# Patient Record
Sex: Female | Born: 1985 | Race: Black or African American | Hispanic: No | Marital: Married | Smoking: Never smoker
Health system: Southern US, Community
[De-identification: ages and names within clinical notes are randomized; demographics above are authoritative.]

## PROBLEM LIST (undated history)

## (undated) DIAGNOSIS — Z789 Other specified health status: Secondary | ICD-10-CM

## (undated) HISTORY — PX: NO PAST SURGERIES: SHX2092

---

## 2014-11-19 NOTE — L&D Delivery Note (Cosign Needed)
Delivery Note Pt pushed effectively with some bradycardia in the 90-100 range.  Upon crowning, after a push, the heart rate went down to the 30's and stayed there.  After consent was obtained from the pt and Dr. Macon Large notified, a Kiwi vacuum was placed on the fetal vertex, which was crowning the size of a kiwi fruit. After one push, the head delivered and the vacuum was d/c'd.  The shoulders were a little tight,  so the posterior (left) axilla was grasped with my index finger, and the baby was rotated clockwise into the oblique diameter.  At this point, the (now) anterior shoulder was released, and the baby delivered.  At no time was any traction placed on the baby's head. At 10:30 PM a viable female was delivered via Vaginal, Vacuum (Extractor) (Presentation: Left Occiput Anterior).  APGAR: 7, 9; weight 8 lb 12.9 oz (3995 g).  THe baby was apneic and floppy, so the cord was milked and clamped and cut, and the baby taken to the warmer, where she perked up quickly with stimulation. Placenta status: Intact, Spontaneous.  Cord: 3 vessels with the following complications: .  Cord pH: 7.12  Anesthesia: Epidural  Episiotomy: None Lacerations: 2nd degree;Perineal Suture Repair: 2.0 chromic Est. Blood Loss (mL):  250 Mom to postpartum.  Baby to Couplet care / Skin to Skin.  CRESENZO-DISHMAN,FRANCES 08/18/2015, 11:06 PM

## 2015-03-14 ENCOUNTER — Other Ambulatory Visit (HOSPITAL_COMMUNITY): Payer: Self-pay | Admitting: Urology

## 2015-03-14 DIAGNOSIS — Z0489 Encounter for examination and observation for other specified reasons: Secondary | ICD-10-CM

## 2015-03-14 DIAGNOSIS — IMO0002 Reserved for concepts with insufficient information to code with codable children: Secondary | ICD-10-CM

## 2015-03-14 LAB — OB RESULTS CONSOLE RPR: RPR: NONREACTIVE

## 2015-03-14 LAB — OB RESULTS CONSOLE HIV ANTIBODY (ROUTINE TESTING): HIV: NONREACTIVE

## 2015-03-14 LAB — SICKLE CELL SCREEN: SICKLE CELL SCREEN: NORMAL

## 2015-03-14 LAB — OB RESULTS CONSOLE RUBELLA ANTIBODY, IGM: Rubella: IMMUNE

## 2015-03-14 LAB — OB RESULTS CONSOLE VARICELLA ZOSTER ANTIBODY, IGG: VARICELLA IGG: IMMUNE

## 2015-03-14 LAB — CYSTIC FIBROSIS DIAGNOSTIC STUDY: Interpretation-CFDNA:: NEGATIVE

## 2015-03-14 LAB — OB RESULTS CONSOLE GC/CHLAMYDIA
CHLAMYDIA, DNA PROBE: NEGATIVE
GC PROBE AMP, GENITAL: NEGATIVE

## 2015-03-14 LAB — OB RESULTS CONSOLE HEPATITIS B SURFACE ANTIGEN: Hepatitis B Surface Ag: NEGATIVE

## 2015-04-04 ENCOUNTER — Ambulatory Visit (HOSPITAL_COMMUNITY)
Admission: RE | Admit: 2015-04-04 | Discharge: 2015-04-04 | Disposition: A | Discharge status: Home / Self Care | Payer: 59 | Source: Ambulatory Visit | Attending: Urology | Admitting: Urology

## 2015-04-04 ENCOUNTER — Encounter (HOSPITAL_COMMUNITY): Payer: Self-pay

## 2015-04-04 DIAGNOSIS — IMO0002 Reserved for concepts with insufficient information to code with codable children: Secondary | ICD-10-CM

## 2015-04-04 DIAGNOSIS — Z36 Encounter for antenatal screening of mother: Secondary | ICD-10-CM | POA: Insufficient documentation

## 2015-04-04 DIAGNOSIS — Z0489 Encounter for examination and observation for other specified reasons: Secondary | ICD-10-CM

## 2015-04-04 IMAGING — US US OB COMP +14 WK
1 series · 12 of 28 positions shown · non-contrast
Comparison: none

[Series 1: us ob +14 all · 76 acquisitions, 12 frames shown]
[im 3/76]
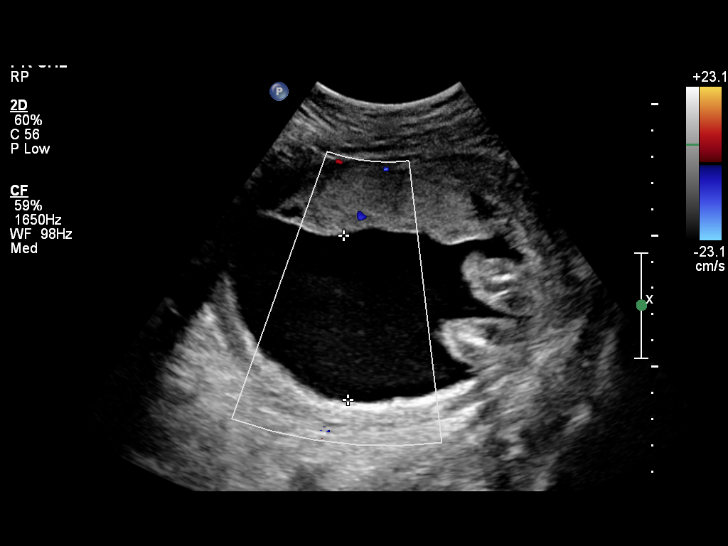
[im 9/76]
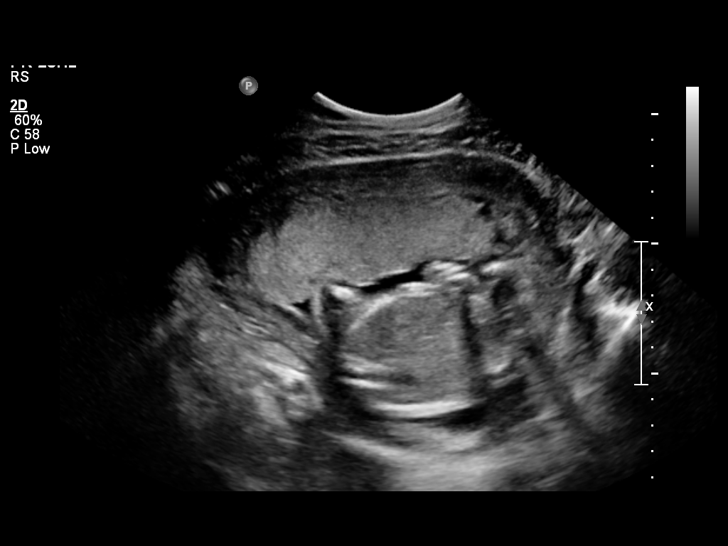
[im 14/76]
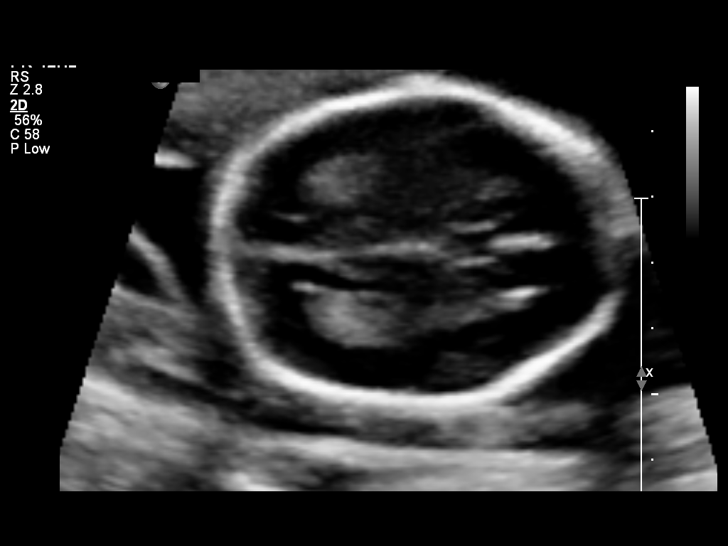
[im 23/76]
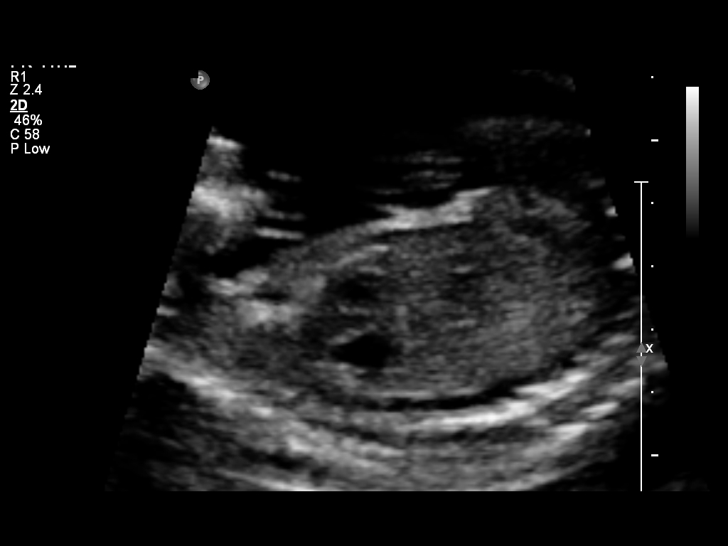
[im 28/76]
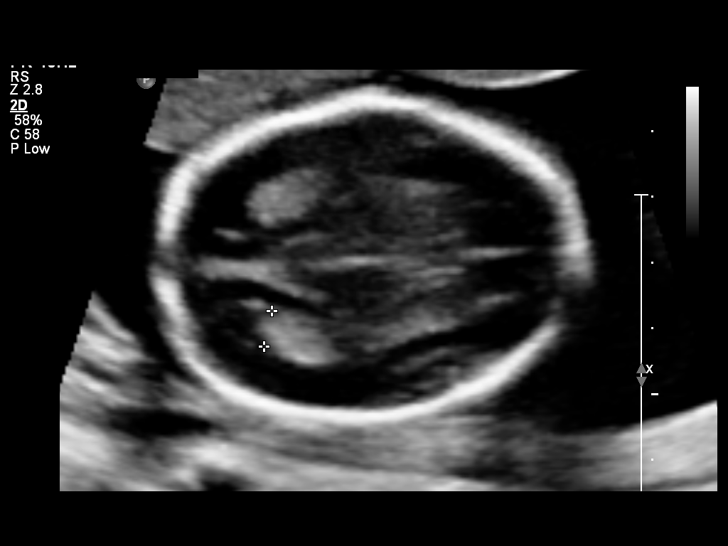
[im 34/76]
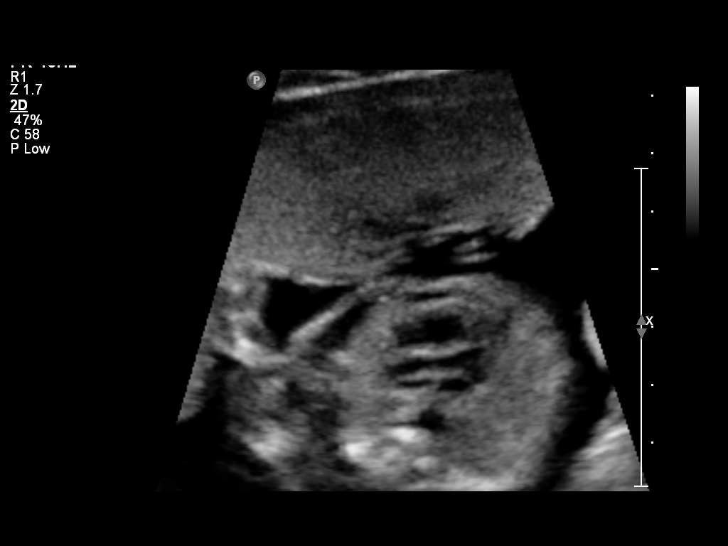
[im 42/76]
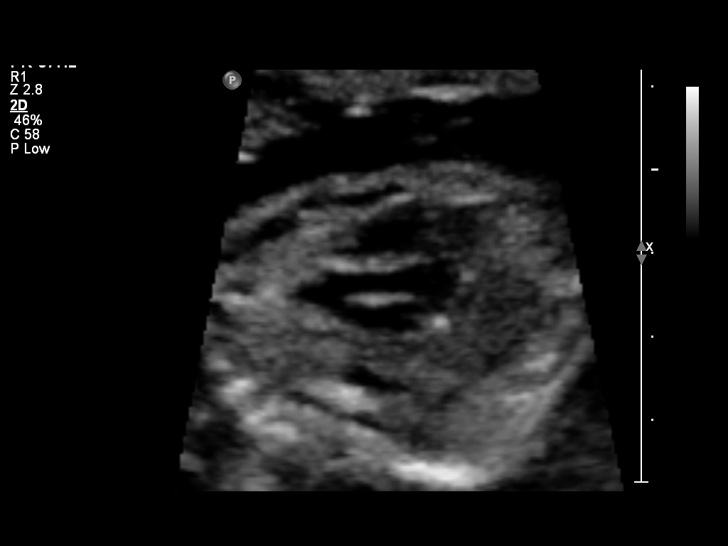
[im 48/76]
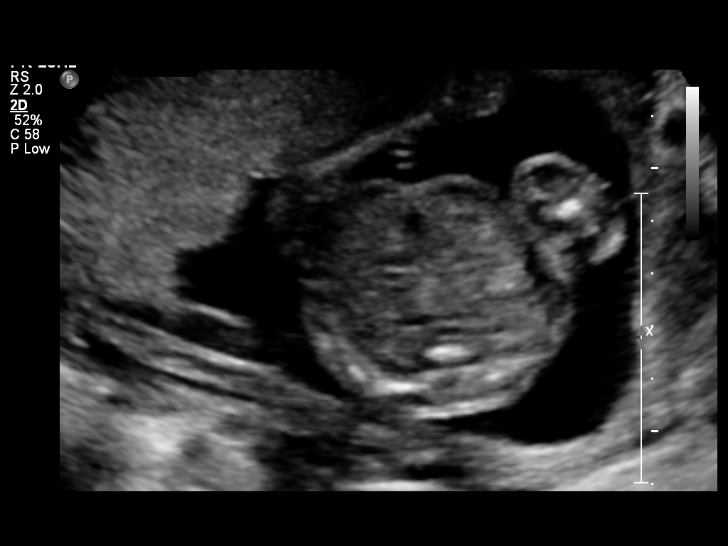
[im 53/76]
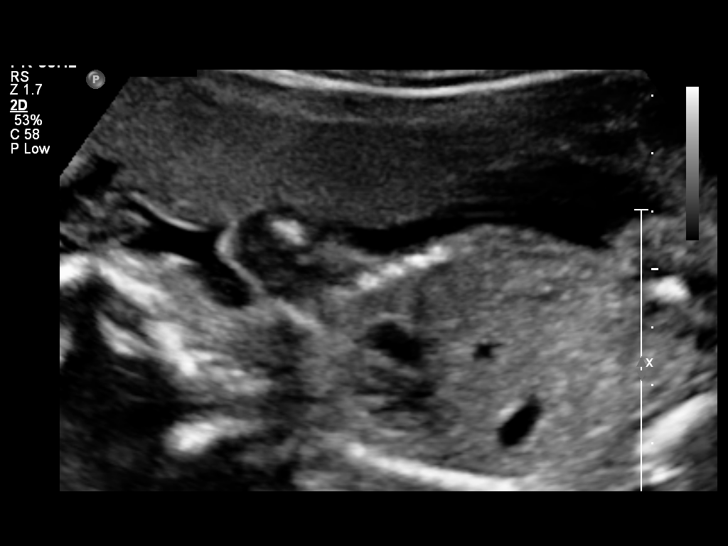
[im 62/76]
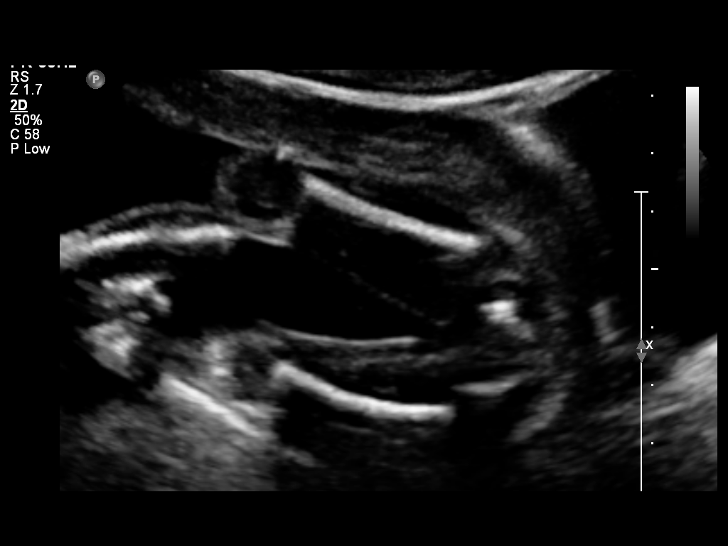
[im 67/76]
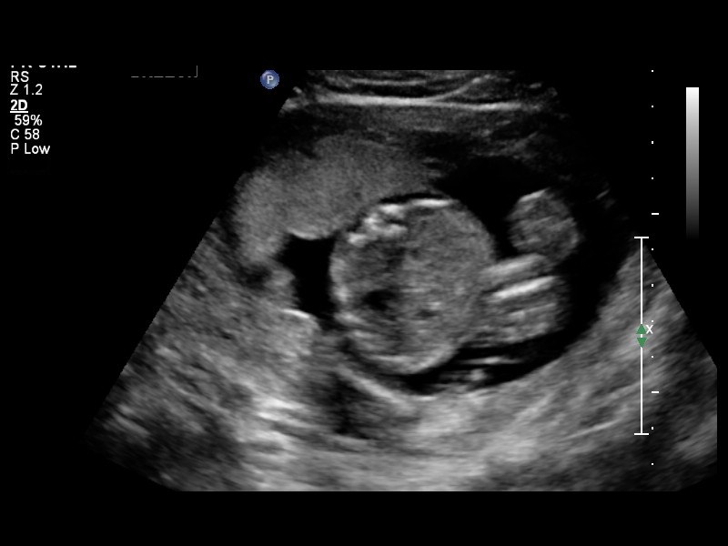
[im 73/76]
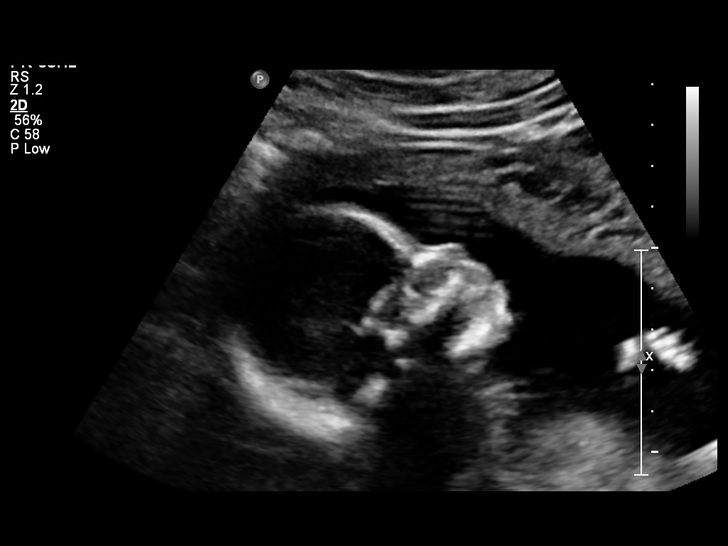

[12 of 28 positions shown; findings below may reference images not displayed]

OBSTETRICS REPORT
(Signed Final [DATE] [DATE])

Service(s) Provided

US OB COMP + 14 WK                                    76805.1
Indications

20 weeks gestation of pregnancy
Basic anatomic survey                                 [id number]
Fetal Evaluation

Num Of Fetuses:    1
Fetal Heart Rate:  152                          bpm
Cardiac Activity:  Observed
Presentation:      Breech
Placenta:          Anterior, above cervical os
P. Cord            Visualized, central
Insertion:

Amniotic Fluid
AFI FV:      Subjectively within normal limits
Larg Pckt:    6.23  cm
Biometry

BPD:     48.1  mm     G. Age:  20w 4d                CI:        70.79   70 - 86
FL/HC:      17.6   15.9 -
20.3
HC:     182.2  mm     G. Age:  20w 4d       37  %    HC/AC:      1.12   1.06 -
1.25
AC:     162.6  mm     G. Age:  21w 3d       64  %    FL/BPD:
FL:      32.1  mm     G. Age:  20w 0d       20  %    FL/AC:      19.7   20 - 24
HUM:     30.9  mm     G. Age:  20w 2d       36  %
CER:     20.6  mm     G. Age:  19w 4d       25  %

Est. FW:     372  gm    0 lb 13 oz      46  %
Gestational Age

LMP:           20w 5d        Date:  [DATE]                 EDD:   [DATE]
U/S Today:     20w 4d                                        EDD:   [DATE]
Best:          20w 5d     Det. By:  LMP  ([DATE])          EDD:   [DATE]
Anatomy

Cranium:          Appears normal         Aortic Arch:      Appears normal
Fetal Cavum:      Appears normal         Ductal Arch:      Appears normal
Ventricles:       Appears normal         Diaphragm:        Appears normal
Choroid Plexus:   Appears normal         Stomach:          Appears normal, left
sided
Cerebellum:       Appears normal         Abdomen:          Appears normal
Posterior Fossa:  Appears normal         Abdominal Wall:   Appears nml (cord
insert, abd wall)
Nuchal Fold:      Not applicable (>20    Cord Vessels:     Appears normal (3
wks GA)                                  vessel cord)
Face:             Appears normal         Kidneys:          Appear normal
(orbits and profile)
Lips:             Appears normal         Bladder:          Appears normal
Heart:            Appears normal         Spine:            Appears normal
(4CH, axis, and
situs)
RVOT:             Appears normal         Lower             Appears normal
Extremities:
LVOT:             Appears normal         Upper             Appears normal
Extremities:

Other:  Fetus appears to be a female. Heels and 5th digit visualized. Nasal
bone visualized.
Targeted Anatomy

Fetal Central Nervous System
Cisterna Magna:
Cervix Uterus Adnexa

Cervical Length:    4.29     cm

Cervix:       Normal appearance by transabdominal scan.
Uterus:       No abnormality visualized.
Cul De Sac:   No free fluid seen.
Left Ovary:    Within normal limits.
Right Ovary:   Within normal limits.

Adnexa:     No abnormality visualized.
Impression

Single IUP at 20w 5d
Normal fetal anatomic survey
No markers associated with aneuploidy noted
Anterior placenta without previa
Normal amniotic fluid volume
Recommendations

Follow-up ultrasounds as clinically indicated.

questions or concerns.

## 2015-07-21 LAB — OB RESULTS CONSOLE GBS: GBS: POSITIVE

## 2015-08-17 ENCOUNTER — Other Ambulatory Visit (HOSPITAL_COMMUNITY): Payer: Self-pay | Admitting: Nurse Practitioner

## 2015-08-17 DIAGNOSIS — Z3A40 40 weeks gestation of pregnancy: Secondary | ICD-10-CM

## 2015-08-17 DIAGNOSIS — O480 Post-term pregnancy: Secondary | ICD-10-CM

## 2015-08-18 ENCOUNTER — Encounter (HOSPITAL_COMMUNITY): Payer: Self-pay | Admitting: *Deleted

## 2015-08-18 ENCOUNTER — Inpatient Hospital Stay (HOSPITAL_COMMUNITY)
Admission: AD | Admit: 2015-08-18 | Discharge: 2015-08-18 | Disposition: A | Discharge status: Home / Self Care | Payer: 59 | Source: Ambulatory Visit | Attending: Obstetrics and Gynecology | Admitting: Obstetrics and Gynecology

## 2015-08-18 ENCOUNTER — Inpatient Hospital Stay (HOSPITAL_COMMUNITY)
Admission: AD | Admit: 2015-08-18 | Discharge: 2015-08-20 | DRG: 775 | Disposition: A | Discharge status: Home / Self Care | Payer: 59 | Source: Ambulatory Visit | Attending: Obstetrics & Gynecology | Admitting: Obstetrics & Gynecology

## 2015-08-18 ENCOUNTER — Inpatient Hospital Stay (HOSPITAL_COMMUNITY): Payer: 59 | Admitting: Anesthesiology

## 2015-08-18 DIAGNOSIS — Z3A40 40 weeks gestation of pregnancy: Secondary | ICD-10-CM

## 2015-08-18 DIAGNOSIS — IMO0001 Reserved for inherently not codable concepts without codable children: Secondary | ICD-10-CM

## 2015-08-18 DIAGNOSIS — O480 Post-term pregnancy: Principal | ICD-10-CM | POA: Diagnosis present

## 2015-08-18 HISTORY — DX: Other specified health status: Z78.9

## 2015-08-18 LAB — CBC
HCT: 36.1 % (ref 36.0–46.0)
Hemoglobin: 12.3 g/dL (ref 12.0–15.0)
MCH: 31.1 pg (ref 26.0–34.0)
MCHC: 34.1 g/dL (ref 30.0–36.0)
MCV: 91.2 fL (ref 78.0–100.0)
PLATELETS: 171 10*3/uL (ref 150–400)
RBC: 3.96 MIL/uL (ref 3.87–5.11)
RDW: 13.7 % (ref 11.5–15.5)
WBC: 11.5 10*3/uL — AB (ref 4.0–10.5)

## 2015-08-18 LAB — TYPE AND SCREEN
ABO/RH(D): O POS
ANTIBODY SCREEN: NEGATIVE

## 2015-08-18 LAB — ABO/RH: ABO/RH(D): O POS

## 2015-08-18 MED ORDER — FENTANYL CITRATE (PF) 100 MCG/2ML IJ SOLN
50.0000 ug | INTRAMUSCULAR | Status: DC | PRN
  Administered 2015-08-18 (×2): 100 ug via INTRAVENOUS
  Filled 2015-08-18 (×2): qty 2

## 2015-08-18 MED ORDER — LACTATED RINGERS IV SOLN
INTRAVENOUS | Status: DC
  Administered 2015-08-18 (×3): via INTRAVENOUS

## 2015-08-18 MED ORDER — PHENYLEPHRINE 40 MCG/ML (10ML) SYRINGE FOR IV PUSH (FOR BLOOD PRESSURE SUPPORT)
80.0000 ug | PREFILLED_SYRINGE | INTRAVENOUS | Status: DC | PRN
  Filled 2015-08-18: qty 20

## 2015-08-18 MED ORDER — LACTATED RINGERS IV SOLN
500.0000 mL | INTRAVENOUS | Status: DC | PRN
  Administered 2015-08-18: 500 mL via INTRAVENOUS

## 2015-08-18 MED ORDER — FENTANYL 2.5 MCG/ML BUPIVACAINE 1/10 % EPIDURAL INFUSION (WH - ANES)
14.0000 mL/h | INTRAMUSCULAR | Status: DC | PRN
  Administered 2015-08-18: 14 mL/h via EPIDURAL
  Administered 2015-08-18: 12 mL/h via EPIDURAL
  Filled 2015-08-18 (×2): qty 125

## 2015-08-18 MED ORDER — OXYTOCIN 40 UNITS IN LACTATED RINGERS INFUSION - SIMPLE MED
1.0000 m[IU]/min | INTRAVENOUS | Status: DC
  Administered 2015-08-18: 2 m[IU]/min via INTRAVENOUS

## 2015-08-18 MED ORDER — ACETAMINOPHEN 325 MG PO TABS: 650.0000 mg | ORAL_TABLET | ORAL | Status: DC | PRN

## 2015-08-18 MED ORDER — PENICILLIN G POTASSIUM 5000000 UNITS IJ SOLR
2.5000 10*6.[IU] | INTRAVENOUS | Status: DC
  Administered 2015-08-18 (×2): 2.5 10*6.[IU] via INTRAVENOUS
  Filled 2015-08-18 (×7): qty 2.5

## 2015-08-18 MED ORDER — BUPIVACAINE HCL (PF) 0.25 % IJ SOLN
INTRAMUSCULAR | Status: DC | PRN
  Administered 2015-08-18 (×2): 4 mL via EPIDURAL

## 2015-08-18 MED ORDER — OXYTOCIN BOLUS FROM INFUSION: 500.0000 mL | INTRAVENOUS | Status: DC

## 2015-08-18 MED ORDER — LIDOCAINE HCL (PF) 1 % IJ SOLN
30.0000 mL | INTRAMUSCULAR | Status: DC | PRN
Start: 2015-08-18 — End: 2015-08-19
  Filled 2015-08-18: qty 30

## 2015-08-18 MED ORDER — OXYCODONE-ACETAMINOPHEN 5-325 MG PO TABS: 2.0000 | ORAL_TABLET | ORAL | Status: DC | PRN

## 2015-08-18 MED ORDER — ONDANSETRON HCL 4 MG/2ML IJ SOLN: 4.0000 mg | Freq: Four times a day (QID) | INTRAMUSCULAR | Status: DC | PRN

## 2015-08-18 MED ORDER — CITRIC ACID-SODIUM CITRATE 334-500 MG/5ML PO SOLN: 30.0000 mL | ORAL | Status: DC | PRN

## 2015-08-18 MED ORDER — OXYTOCIN 40 UNITS IN LACTATED RINGERS INFUSION - SIMPLE MED
62.5000 mL/h | INTRAVENOUS | Status: DC
  Administered 2015-08-18: 62.5 mL/h via INTRAVENOUS
  Filled 2015-08-18: qty 1000

## 2015-08-18 MED ORDER — OXYCODONE-ACETAMINOPHEN 5-325 MG PO TABS: 1.0000 | ORAL_TABLET | ORAL | Status: DC | PRN

## 2015-08-18 MED ORDER — LIDOCAINE-EPINEPHRINE (PF) 2 %-1:200000 IJ SOLN
INTRAMUSCULAR | Status: DC | PRN
  Administered 2015-08-18: 4 mL

## 2015-08-18 MED ORDER — TERBUTALINE SULFATE 1 MG/ML IJ SOLN: 0.2500 mg | Freq: Once | INTRAMUSCULAR | Status: DC | PRN

## 2015-08-18 MED ORDER — DIPHENHYDRAMINE HCL 50 MG/ML IJ SOLN: 12.5000 mg | INTRAMUSCULAR | Status: DC | PRN

## 2015-08-18 MED ORDER — PENICILLIN G POTASSIUM 5000000 UNITS IJ SOLR
5.0000 10*6.[IU] | Freq: Once | INTRAVENOUS | Status: AC
  Administered 2015-08-18: 5 10*6.[IU] via INTRAVENOUS
  Filled 2015-08-18: qty 5

## 2015-08-18 MED ORDER — FLEET ENEMA 7-19 GM/118ML RE ENEM: 1.0000 | ENEMA | RECTAL | Status: DC | PRN

## 2015-08-18 MED ORDER — EPHEDRINE 5 MG/ML INJ: 10.0000 mg | INTRAVENOUS | Status: DC | PRN

## 2015-08-18 NOTE — Anesthesia Preprocedure Evaluation (Signed)
Anesthesia Evaluation  Patient identified by MRN, date of birth, ID band Patient awake    Reviewed: Allergy & Precautions, Patient's Chart, lab work & pertinent test results  History of Anesthesia Complications Negative for: history of anesthetic complications  Airway Mallampati: III  TM Distance: >3 FB Neck ROM: Full    Dental  (+) Teeth Intact   Pulmonary neg pulmonary ROS,  breath sounds clear to auscultation        Cardiovascular negative cardio ROS  Rhythm:Regular     Neuro/Psych negative neurological ROS  negative psych ROS   GI/Hepatic negative GI ROS, Neg liver ROS,   Endo/Other  negative endocrine ROS  Renal/GU negative Renal ROS     Musculoskeletal   Abdominal   Peds  Hematology negative hematology ROS (+)   Anesthesia Other Findings   Reproductive/Obstetrics (+) Pregnancy                             Anesthesia Physical Anesthesia Plan  ASA: II  Anesthesia Plan: Epidural   Post-op Pain Management:    Induction:   Airway Management Planned:   Additional Equipment:   Intra-op Plan:   Post-operative Plan:   Informed Consent: I have reviewed the patients History and Physical, chart, labs and discussed the procedure including the risks, benefits and alternatives for the proposed anesthesia with the patient or authorized representative who has indicated his/her understanding and acceptance.   Dental advisory given  Plan Discussed with: Anesthesiologist  Anesthesia Plan Comments:         Anesthesia Quick Evaluation  

## 2015-08-18 NOTE — Progress Notes (Signed)
   Alicia Avila is a 29 y.o. G1P0 at [redacted]w[redacted]d  admitted for active labor  Subjective: Comfortable; no pressure  Objective: Filed Vitals:   08/18/15 1900 08/18/15 1930 08/18/15 2000 08/18/15 2030  BP: 134/80 131/76 128/77 115/69  Pulse: 93 101 98 98  Temp:  99.2 F (37.3 C)    TempSrc:  Oral    Resp: 18 18    Height:      Weight:      SpO2:          FHT:  FHR: 125 bpm, variability: moderate,  accelerations:  Present,  decelerations:  Absent UC:   irregular, every 1.5-4 minutes SVE:   Dilation: Lip/rim Effacement (%): 90 Station: +1 Exam by:: Cammy Copa, RN Pitocin @ 6 mu/min  Labs: Lab Results  Component Value Date   WBC 11.5* 08/18/2015   HGB 12.3 08/18/2015   HCT 36.1 08/18/2015   MCV 91.2 08/18/2015   PLT 171 08/18/2015    Assessment / Plan: Spontaneous labor, progressing normally  Labor: Progressing normally Fetal Wellbeing:  Category I Pain Control:  Epidural Anticipated MOD:  NSVD  CRESENZO-DISHMAN,FRANCES 08/18/2015, 9:35 PM

## 2015-08-18 NOTE — Anesthesia Procedure Notes (Signed)
Epidural Patient location during procedure: OB  Staffing Anesthesiologist: MOSER, CHRISTOPHER Performed by: anesthesiologist   Preanesthetic Checklist Completed: patient identified, surgical consent, pre-op evaluation, timeout performed, IV checked, risks and benefits discussed and monitors and equipment checked  Epidural Patient position: sitting Prep: DuraPrep Patient monitoring: heart rate, cardiac monitor, continuous pulse ox and blood pressure Approach: midline Location: L3-L4 Injection technique: LOR saline  Needle:  Needle type: Tuohy  Needle gauge: 17 G Needle length: 9 cm Needle insertion depth: 7 cm Catheter type: closed end flexible Catheter size: 19 Gauge Catheter at skin depth: 14 cm Test dose: negative and 2% lidocaine with Epi 1:200 K  Assessment Events: blood not aspirated, injection not painful, no injection resistance, negative IV test and no paresthesia  Additional Notes Reason for block:procedure for pain   

## 2015-08-18 NOTE — Discharge Instructions (Signed)
Third Trimester of Pregnancy °The third trimester is from week 29 through week 42, months 7 through 9. The third trimester is a time when the fetus is growing rapidly. At the end of the ninth month, the fetus is about 20 inches in length and weighs 6-10 pounds.  °BODY CHANGES °Your body goes through many changes during pregnancy. The changes vary from woman to woman.  °· Your weight will continue to increase. You can expect to gain 25-35 pounds (11-16 kg) by the end of the pregnancy. °· You may begin to get stretch marks on your hips, abdomen, and breasts. °· You may urinate more often because the fetus is moving lower into your pelvis and pressing on your bladder. °· You may develop or continue to have heartburn as a result of your pregnancy. °· You may develop constipation because certain hormones are causing the muscles that push waste through your intestines to slow down. °· You may develop hemorrhoids or swollen, bulging veins (varicose veins). °· You may have pelvic pain because of the weight gain and pregnancy hormones relaxing your joints between the bones in your pelvis. Backaches may result from overexertion of the muscles supporting your posture. °· You may have changes in your hair. These can include thickening of your hair, rapid growth, and changes in texture. Some women also have hair loss during or after pregnancy, or hair that feels dry or thin. Your hair will most likely return to normal after your baby is born. °· Your breasts will continue to grow and be tender. A yellow discharge may leak from your breasts called colostrum. °· Your belly button may stick out. °· You may feel short of breath because of your expanding uterus. °· You may notice the fetus "dropping," or moving lower in your abdomen. °· You may have a bloody mucus discharge. This usually occurs a few days to a week before labor begins. °· Your cervix becomes thin and soft (effaced) near your due date. °WHAT TO EXPECT AT YOUR PRENATAL  EXAMS  °You will have prenatal exams every 2 weeks until week 36. Then, you will have weekly prenatal exams. During a routine prenatal visit: °· You will be weighed to make sure you and the fetus are growing normally. °· Your blood pressure is taken. °· Your abdomen will be measured to track your baby's growth. °· The fetal heartbeat will be listened to. °· Any test results from the previous visit will be discussed. °· You may have a cervical check near your due date to see if you have effaced. °At around 36 weeks, your caregiver will check your cervix. At the same time, your caregiver will also perform a test on the secretions of the vaginal tissue. This test is to determine if a type of bacteria, Group B streptococcus, is present. Your caregiver will explain this further. °Your caregiver may ask you: °· What your birth plan is. °· How you are feeling. °· If you are feeling the baby move. °· If you have had any abnormal symptoms, such as leaking fluid, bleeding, severe headaches, or abdominal cramping. °· If you have any questions. °Other tests or screenings that may be performed during your third trimester include: °· Blood tests that check for low iron levels (anemia). °· Fetal testing to check the health, activity level, and growth of the fetus. Testing is done if you have certain medical conditions or if there are problems during the pregnancy. °FALSE LABOR °You may feel small, irregular contractions that   eventually go away. These are called Braxton Hicks contractions, or false labor. Contractions may last for hours, days, or even weeks before true labor sets in. If contractions come at regular intervals, intensify, or become painful, it is best to be seen by your caregiver.  °SIGNS OF LABOR  °· Menstrual-like cramps. °· Contractions that are 5 minutes apart or less. °· Contractions that start on the top of the uterus and spread down to the lower abdomen and back. °· A sense of increased pelvic pressure or back  pain. °· A watery or bloody mucus discharge that comes from the vagina. °If you have any of these signs before the 37th week of pregnancy, call your caregiver right away. You need to go to the hospital to get checked immediately. °HOME CARE INSTRUCTIONS  °· Avoid all smoking, herbs, alcohol, and unprescribed drugs. These chemicals affect the formation and growth of the baby. °· Follow your caregiver's instructions regarding medicine use. There are medicines that are either safe or unsafe to take during pregnancy. °· Exercise only as directed by your caregiver. Experiencing uterine cramps is a good sign to stop exercising. °· Continue to eat regular, healthy meals. °· Wear a good support bra for breast tenderness. °· Do not use hot tubs, steam rooms, or saunas. °· Wear your seat belt at all times when driving. °· Avoid raw meat, uncooked cheese, cat litter boxes, and soil used by cats. These carry germs that can cause birth defects in the baby. °· Take your prenatal vitamins. °· Try taking a stool softener (if your caregiver approves) if you develop constipation. Eat more high-fiber foods, such as fresh vegetables or fruit and whole grains. Drink plenty of fluids to keep your urine clear or pale yellow. °· Take warm sitz baths to soothe any pain or discomfort caused by hemorrhoids. Use hemorrhoid cream if your caregiver approves. °· If you develop varicose veins, wear support hose. Elevate your feet for 15 minutes, 3-4 times a day. Limit salt in your diet. °· Avoid heavy lifting, wear low heal shoes, and practice good posture. °· Rest a lot with your legs elevated if you have leg cramps or low back pain. °· Visit your dentist if you have not gone during your pregnancy. Use a soft toothbrush to brush your teeth and be gentle when you floss. °· A sexual relationship may be continued unless your caregiver directs you otherwise. °· Do not travel far distances unless it is absolutely necessary and only with the approval  of your caregiver. °· Take prenatal classes to understand, practice, and ask questions about the labor and delivery. °· Make a trial run to the hospital. °· Pack your hospital bag. °· Prepare the baby's nursery. °· Continue to go to all your prenatal visits as directed by your caregiver. °SEEK MEDICAL CARE IF: °· You are unsure if you are in labor or if your water has broken. °· You have dizziness. °· You have mild pelvic cramps, pelvic pressure, or nagging pain in your abdominal area. °· You have persistent nausea, vomiting, or diarrhea. °· You have a bad smelling vaginal discharge. °· You have pain with urination. °SEEK IMMEDIATE MEDICAL CARE IF:  °· You have a fever. °· You are leaking fluid from your vagina. °· You have spotting or bleeding from your vagina. °· You have severe abdominal cramping or pain. °· You have rapid weight loss or gain. °· You have shortness of breath with chest pain. °· You notice sudden or extreme swelling   of your face, hands, ankles, feet, or legs. °· You have not felt your baby move in over an hour. °· You have severe headaches that do not go away with medicine. °· You have vision changes. °Document Released: 10/30/2001 Document Revised: 11/10/2013 Document Reviewed: 01/06/2013 °ExitCare® Patient Information ©2015 [employer]. This information is not intended to replace advice given to you by your health care provider. Make sure you discuss any questions you have with your health care provider. °Braxton Hicks Contractions °Contractions of the uterus can occur throughout pregnancy. Contractions are not always a sign that you are in labor.  °WHAT ARE BRAXTON HICKS CONTRACTIONS?  °Contractions that occur before labor are called Braxton Hicks contractions, or false labor. Toward the end of pregnancy (32-34 weeks), these contractions can develop more often and may become more forceful. This is not true labor because these contractions do not result in opening (dilatation) and thinning of  the cervix. They are sometimes difficult to tell apart from true labor because these contractions can be forceful and people have different pain tolerances. You should not feel embarrassed if you go to the hospital with false labor. Sometimes, the only way to tell if you are in true labor is for your health care provider to look for changes in the cervix. °If there are no prenatal problems or other health problems associated with the pregnancy, it is completely safe to be sent home with false labor and await the onset of true labor. °HOW CAN YOU TELL THE DIFFERENCE BETWEEN TRUE AND FALSE LABOR? °False Labor °· The contractions of false labor are usually shorter and not as hard as those of true labor.   °· The contractions are usually irregular.   °· The contractions are often felt in the front of the lower abdomen and in the groin.   °· The contractions may go away when you walk around or change positions while lying down.   °· The contractions get weaker and are shorter lasting as time goes on.   °· The contractions do not usually become progressively stronger, regular, and closer together as with true labor.   °True Labor °· Contractions in true labor last 30-70 seconds, become very regular, usually become more intense, and increase in frequency.   °· The contractions do not go away with walking.   °· The discomfort is usually felt in the top of the uterus and spreads to the lower abdomen and low back.   °· True labor can be determined by your health care provider with an exam. This will show that the cervix is dilating and getting thinner.   °WHAT TO REMEMBER °· Keep up with your usual exercises and follow other instructions given by your health care provider.   °· Take medicines as directed by your health care provider.   °· Keep your regular prenatal appointments.   °· Eat and drink lightly if you think you are going into labor.   °· If Braxton Hicks contractions are making you uncomfortable:   °· Change your  position from lying down or resting to walking, or from walking to resting.   °· Sit and rest in a tub of warm water.   °· Drink 2-3 glasses of water. Dehydration may cause these contractions.   °· Do slow and deep breathing several times an hour.   °WHEN SHOULD I SEEK IMMEDIATE MEDICAL CARE? °Seek immediate medical care if: °· Your contractions become stronger, more regular, and closer together.   °· You have fluid leaking or gushing from your vagina.   °· You have a fever.   °· You pass blood-tinged mucus.   °·   You have vaginal bleeding.   °· You have continuous abdominal pain.   °· You have low back pain that you never had before.   °· You feel your baby's head pushing down and causing pelvic pressure.   °· Your baby is not moving as much as it used to.   °Document Released: 11/05/2005 Document Revised: 11/10/2013 Document Reviewed: 08/17/2013 °ExitCare® Patient Information ©2015 [employer]. This information is not intended to replace advice given to you by your health care provider. Make sure you discuss any questions you have with your health care provider. ° °

## 2015-08-18 NOTE — H&P (Signed)
OBSTETRIC ADMISSION HISTORY AND PHYSICAL  Alicia Avila is a 29 y.o. female G1P0 with IUP at [redacted]w[redacted]d by LMP c/w 2nd trimester u/s presenting for SOL/SROM. SROM @ 745. She reports +FMs, no VB, no blurry vision, headaches or peripheral edema, and RUQ pain.  She plans on breast and bottle feeding. She request POPs for birth control.  Dating: By LMP c/w 20+5 u/s --->  Estimated Date of Delivery: 08/17/15  Sono:    , CWD, normal anatomy, breech presentation, breech lie, 372g, 46% EFW   Prenatal History/Complications:  Past Medical History: Past Medical History  Diagnosis Date  . Medical history non-contributory     Past Surgical History: Past Surgical History  Procedure Laterality Date  . No past surgeries      Obstetrical History: OB History    Gravida Para Term Preterm AB TAB SAB Ectopic Multiple Living   1 0        0      Social History: Social History   Social History  . Marital Status: Married    Spouse Name: N/A  . Number of Children: N/A  . Years of Education: N/A   Social History Main Topics  . Smoking status: Never Smoker   . Smokeless tobacco: None  . Alcohol Use: No  . Drug Use: No  . Sexual Activity: Yes    Birth Control/ Protection: None   Other Topics Concern  . None   Social History Narrative    Family History: History reviewed. No pertinent family history.  Allergies: Not on File  Prescriptions prior to admission  Medication Sig Dispense Refill Last Dose  . prenatal vitamin w/FE, FA (PRENATAL 1 + 1) 27-1 MG TABS tablet Take 1 tablet by mouth daily at 12 noon.   08/18/2015 at Unknown time     Review of Systems   All systems reviewed and negative except as stated in HPI  Blood pressure 128/72, pulse 102, temperature 98 F (36.7 C), temperature source Oral, resp. rate 18, height  (1.626 m), weight 205 lb (92.987 kg). General appearance: alert, cooperative, appears stated age and mild distress Lungs: clear to auscultation  bilaterally Heart: regular rate and rhythm Abdomen: soft, non-tender; bowel sounds normal Pelvic: 4/100/-1 Extremities: Homans sign is negative, no sign of DVT DTR's 2/4 Presentation: cephalic Fetal monitoringBaseline: 135 bpm, Variability: Good {> 6 bpm), Accelerations: Reactive and Decelerations: Absent Uterine activityFrequency: Every 4-5 minutes Dilation: 4.5 Effacement (%): 100 Station: -1 Exam by:: Dellie Burns, RN BSN   Prenatal labs: ABO, Rh:  O+ Antibody:  Negative Rubella:  Immune RPR:   Negative HBsAg:   Negative HIV:    Negative GBS:   Positive, no allergies 1 hr Glucola 178; 3hr pass: 87/174/118/109 Genetic screening  Quad screen negative Anatomy US wnl  Prenatal Transfer Tool  Maternal Diabetes: No- failed screening 178, 3hr passed-87/174/118/109 Genetic Screening: Normal Maternal Ultrasounds/Referrals: Normal Fetal Ultrasounds or other Referrals:  None Maternal Substance Abuse:  No Significant Maternal Medications:  None Significant Maternal Lab Results: Lab values include: Group B Strep positive  No results found for this or any previous visit (from the past 24 hour(s)).  There are no active problems to display for this patient.   Assessment: Alicia Avila is a 29 y.o. G1P0 at [redacted]w[redacted]d here for SOL/SROM at term  #Labor:Expectant management.  #Pain: Discussed IV vs epidural if patient changes her mind. Fentanyl q1h PRN available. #FWB: Category 1 tracing #ID:  GBS positive-PCN #MOF: breast/bottle #MOC:POPs. Discussed IUD and nexplanon  as well, pt will consider these options but likely POPs. #ASCUS/HRHPV positive pap - per HD records had normal colpo, recommending repeat colpo postpartum, patient aware.  Durenda Hurt, DO 08/18/2015, 9:02 AM    OB FELLOW HISTORY AND PHYSICAL ATTESTATION  I have seen and examined this patient; I agree with above documentation in the resident's note.   Alicia Avila 08/18/2015, 9:33 AM

## 2015-08-18 NOTE — MAU Note (Signed)
Patient presents at [redacted] weeks gestation with c/o contractions every 4 minutes since 0530. Fetus active. Denies bleeding. Stated she thought her water broke upon arrival to unit.

## 2015-08-18 NOTE — MAU Note (Signed)
Pt complains of contractions for the last few hours, denies bleeding or ROM.

## 2015-08-18 NOTE — Progress Notes (Signed)
Labor Progress Note Alicia Avila is a 29 y.o. G1P0 at [redacted]w[redacted]d presented for SROM/SOL  S: progressed well but then ctx spaced. S/p epidural  O:  BP 125/73 mmHg  Pulse 87  Temp(Src) 98.3 F (36.8 C) (Oral)  Resp 18  Ht  (1.626 m)  Wt 205 lb (92.987 kg)  BMI 35.17 kg/m2  SpO2 99% EFM: 135/mod/+accels, no decels  CVE: Dilation: 6.5 Effacement (%): 100 Cervical Position: Middle Station: -2 Presentation: Vertex Exam by:: Newton   A&P: 29 y.o. G1P0 [redacted]w[redacted]d here with SROM/SOL #Labor: Progressing slowly into active labor. Started Pit, currently . AROM forebag-meconium. Continued with augmentation.  #Pain: epidural #FWB: Cat I #GBS: positive, on PCN.   Federico Flake, MD 4:08 PM

## 2015-08-19 ENCOUNTER — Encounter (HOSPITAL_COMMUNITY): Payer: Self-pay

## 2015-08-19 LAB — RPR: RPR: NONREACTIVE

## 2015-08-19 MED ORDER — MEASLES, MUMPS & RUBELLA VAC ~~LOC~~ INJ
0.5000 mL | INJECTION | Freq: Once | SUBCUTANEOUS | Status: DC
  Filled 2015-08-19: qty 0.5

## 2015-08-19 MED ORDER — ACETAMINOPHEN 325 MG PO TABS: 650.0000 mg | ORAL_TABLET | ORAL | Status: DC | PRN

## 2015-08-19 MED ORDER — BENZOCAINE-MENTHOL 20-0.5 % EX AERO: 1.0000 "application " | INHALATION_SPRAY | CUTANEOUS | Status: DC | PRN

## 2015-08-19 MED ORDER — SENNOSIDES-DOCUSATE SODIUM 8.6-50 MG PO TABS
2.0000 | ORAL_TABLET | ORAL | Status: DC
  Administered 2015-08-20: 2 via ORAL
  Filled 2015-08-19: qty 2

## 2015-08-19 MED ORDER — BISACODYL 10 MG RE SUPP: 10.0000 mg | Freq: Every day | RECTAL | Status: DC | PRN

## 2015-08-19 MED ORDER — SIMETHICONE 80 MG PO CHEW
80.0000 mg | CHEWABLE_TABLET | ORAL | Status: DC | PRN
Start: 2015-08-19 — End: 2015-08-20

## 2015-08-19 MED ORDER — OXYCODONE-ACETAMINOPHEN 5-325 MG PO TABS: 2.0000 | ORAL_TABLET | ORAL | Status: DC | PRN

## 2015-08-19 MED ORDER — DIBUCAINE 1 % RE OINT: 1.0000 "application " | TOPICAL_OINTMENT | RECTAL | Status: DC | PRN

## 2015-08-19 MED ORDER — ONDANSETRON HCL 4 MG/2ML IJ SOLN: 4.0000 mg | INTRAMUSCULAR | Status: DC | PRN

## 2015-08-19 MED ORDER — IBUPROFEN 600 MG PO TABS
600.0000 mg | ORAL_TABLET | Freq: Four times a day (QID) | ORAL | Status: DC
  Administered 2015-08-19 – 2015-08-20 (×6): 600 mg via ORAL
  Filled 2015-08-19 (×6): qty 1

## 2015-08-19 MED ORDER — FLEET ENEMA 7-19 GM/118ML RE ENEM: 1.0000 | ENEMA | Freq: Every day | RECTAL | Status: DC | PRN

## 2015-08-19 MED ORDER — DIPHENHYDRAMINE HCL 25 MG PO CAPS: 25.0000 mg | ORAL_CAPSULE | Freq: Four times a day (QID) | ORAL | Status: DC | PRN

## 2015-08-19 MED ORDER — METHYLERGONOVINE MALEATE 0.2 MG/ML IJ SOLN: 0.2000 mg | INTRAMUSCULAR | Status: DC | PRN

## 2015-08-19 MED ORDER — OXYCODONE-ACETAMINOPHEN 5-325 MG PO TABS: 1.0000 | ORAL_TABLET | ORAL | Status: DC | PRN

## 2015-08-19 MED ORDER — METHYLERGONOVINE MALEATE 0.2 MG PO TABS: 0.2000 mg | ORAL_TABLET | ORAL | Status: DC | PRN

## 2015-08-19 MED ORDER — TETANUS-DIPHTH-ACELL PERTUSSIS 5-2.5-18.5 LF-MCG/0.5 IM SUSP: 0.5000 mL | Freq: Once | INTRAMUSCULAR | Status: DC

## 2015-08-19 MED ORDER — WITCH HAZEL-GLYCERIN EX PADS: 1.0000 "application " | MEDICATED_PAD | CUTANEOUS | Status: DC | PRN

## 2015-08-19 MED ORDER — LANOLIN HYDROUS EX OINT: TOPICAL_OINTMENT | CUTANEOUS | Status: DC | PRN

## 2015-08-19 MED ORDER — ONDANSETRON HCL 4 MG PO TABS: 4.0000 mg | ORAL_TABLET | ORAL | Status: DC | PRN

## 2015-08-19 MED ORDER — OXYTOCIN 40 UNITS IN LACTATED RINGERS INFUSION - SIMPLE MED: 62.5000 mL/h | INTRAVENOUS | Status: DC | PRN

## 2015-08-19 MED ORDER — ZOLPIDEM TARTRATE 5 MG PO TABS: 5.0000 mg | ORAL_TABLET | Freq: Every evening | ORAL | Status: DC | PRN

## 2015-08-19 MED ORDER — FERROUS SULFATE 325 (65 FE) MG PO TABS
325.0000 mg | ORAL_TABLET | Freq: Two times a day (BID) | ORAL | Status: DC
  Administered 2015-08-19 (×2): 325 mg via ORAL
  Filled 2015-08-19 (×2): qty 1

## 2015-08-19 MED ORDER — PRENATAL MULTIVITAMIN CH
1.0000 | ORAL_TABLET | Freq: Every day | ORAL | Status: DC
  Administered 2015-08-19: 1 via ORAL
  Filled 2015-08-19: qty 1

## 2015-08-19 NOTE — Lactation Note (Signed)
This note was copied from the chart of Alicia Avila. Lactation Consultation Note  Patient Name: Alicia Avila [id number] Date: 08/19/2015 Reason for consult: Initial assessment Mom has been supplementing today reporting baby not latching. Baby is sucking her tongue and upper lip, very tight mouth. Demonstrated jaw massage and suck training but baby still not able to latch. Initiated #24 nipple shield, after few attempts baby able to obtain depth and sustain the latch. Baby nursed for 10 minutes with nipple shield, then removed nipple shield and baby re-latched with sandwiching Mom's nipple. Baby sustained a good suckling pattern. Mom's nipple had slight crease when baby came off the breast. Basic teaching reviewed with Mom. Lactation brochure left for review. Supplemental guidelines reviewed with Mom. Encouraged not to use bottle to supplement especially due to baby's tight mouth. Encouraged Mom to call with next feeding for assist. If Mom continues to supplement advised we could review some other methods besides using bottle nipples at this point. Mom receptive to teaching.   Maternal Data Has patient been taught Hand Expression?: Yes Does the patient have breastfeeding experience prior to this delivery?: No  Feeding Feeding Type: Breast Fed Length of feed: 25 min  LATCH Score/Interventions Latch: Repeated attempts needed to sustain latch, nipple held in mouth throughout feeding, stimulation needed to elicit sucking reflex. (initiated #24 nipple shield) Intervention(s): Adjust position;Assist with latch;Breast massage;Breast compression  Audible Swallowing: A few with stimulation  Type of Nipple: Everted at rest and after stimulation  Comfort (Breast/Nipple): Soft / non-tender     Hold (Positioning): Assistance needed to correctly position infant at breast and maintain latch. Intervention(s): Breastfeeding basics reviewed;Support Pillows;Position options;Skin to skin  LATCH  Score: 7  Lactation Tools Discussed/Used Tools: Nipple Shields Nipple shield size: 24 WIC Program: No   Consult Status Consult Status: Follow-up Date: 08/19/15 Follow-up type: In-patient    Alfred Levins 08/19/2015, 5:28 PM

## 2015-08-19 NOTE — Progress Notes (Cosign Needed)
Post Partum Day 1 Subjective:  Alicia Avila is a 29 y.o. G1P1001 [redacted]w[redacted]d s/p VAVD for terminal bradycardia of fetus.  No acute events overnight.  Pt denies problems with ambulating, voiding or po intake.  She denies nausea or vomiting.  Pain is well controlled.  She has had flatus. She has not had bowel movement.  Lochia Small.  Plan for birth control is oral progesterone-only contraceptive.  Method of Feeding: both breast/bottle  Objective: Blood pressure 116/65, pulse 87, temperature 98.5 F (36.9 C), temperature source Oral, resp. rate 18, height  (1.626 m), weight 205 lb (92.987 kg), SpO2 99 %, unknown if currently breastfeeding.  Physical Exam:  General: alert, cooperative and no distress Lochia:normal flow Chest: CTAB Heart: RRR no m/r/g Abdomen: +BS, soft, nontender,  Uterine Fundus: firm, at level of umbilicus DVT Evaluation: No evidence of DVT seen on physical exam. Extremities: on patient edema   Recent Labs  08/18/15 0832  HGB 12.3  HCT 36.1    Assessment/Plan:  ASSESSMENT: Alicia Avila is a 29 y.o. G1P1001 [redacted]w[redacted]d s/p VAVD due to fetal terminal bradycardia  Plan for discharge tomorrow, Breastfeeding and Lactation consult   LOS: 1 day   Durenda Hurt 08/19/2015, 9:44 AM

## 2015-08-19 NOTE — Anesthesia Postprocedure Evaluation (Signed)
Anesthesia Post Note  Patient: Alicia Avila  Procedure(s) Performed: * No procedures listed *  Anesthesia type: Epidural  Patient location: Mother/Baby  Post pain: Pain level controlled  Post assessment: Post-op Vital signs reviewed  Last Vitals:  Filed Vitals:   08/19/15 0506  BP: 116/65  Pulse: 87  Temp: 36.9 C  Resp: 18    Post vital signs: Reviewed  Level of consciousness: awake  Complications: No apparent anesthesia complications

## 2015-08-20 MED ORDER — NORETHINDRONE 0.35 MG PO TABS: 1.0000 | ORAL_TABLET | Freq: Every day | ORAL | Status: DC

## 2015-08-20 MED ORDER — IBUPROFEN 600 MG PO TABS: 600.0000 mg | ORAL_TABLET | Freq: Four times a day (QID) | ORAL | Status: DC | PRN

## 2015-08-20 NOTE — Discharge Instructions (Signed)

## 2015-08-20 NOTE — Lactation Note (Addendum)
This note was copied from the chart of Alicia Avila. Lactation Consultation Note New mom states that her nipples are sore, cracked and bleeding. Mom was sleepy, open to teaching. Baby was sleepy as well. Sucks on top lip. Mom has short shaft. Baby is unable to maintain latch w/o NS. Mom has #24 NS, could use a #20, encouraged to get a #20 d/t out of stock. Mom states comfortable when baby suckles w/NS. Baby sleeping w/tongue resting on lips w/mouth slightly closed, but tongue is protruding out slightly. Appears that bottom lip protrudes out a little further than top lip, then baby sucks on top lip. Baby has little mouth. Taking bottles well. Mom states she is going to do both. Reviewed supply and demand, offering breast first before formula. Discussed formula feeding cuts back milk supply. Discussed engorgement. Mom is getting pump from insurance company. Encouraged mom to make F/U appt. W/LC using NS.  Encouraged I&O. Comfort gels given for soreness. Patient Name: Alicia Avila [id number] Date: 08/20/2015 Reason for consult: Follow-up assessment   Maternal Data    Feeding Feeding Type: Breast Fed Length of feed: 0 min  LATCH Score/Interventions Latch: Too sleepy or reluctant, no latch achieved, no sucking elicited. Intervention(s): Waking techniques;Teach feeding cues;Skin to skin Intervention(s): Adjust position;Assist with latch;Breast massage;Breast compression  Audible Swallowing: None Intervention(s): Hand expression;Alternate breast massage  Type of Nipple: Everted at rest and after stimulation (short shaft)  Comfort (Breast/Nipple): Filling, red/small blisters or bruises, mild/mod discomfort  Problem noted: Cracked, bleeding, blisters, bruises Interventions  (Cracked/bleeding/bruising/blister): Expressed breast milk to nipple  Hold (Positioning): Assistance needed to correctly position infant at breast and maintain latch. Intervention(s): Breastfeeding basics  reviewed;Support Pillows;Position options;Skin to skin  LATCH Score: 4  Lactation Tools Discussed/Used Tools: Nipple Shields Nipple shield size: 24   Consult Status Consult Status: Complete Date: 08/20/15    Charyl Dancer 08/20/2015, 12:49 PM

## 2015-08-20 NOTE — Discharge Summary (Signed)
OB Discharge Summary     Patient Name: Alicia Avila DOB: 1986-01-19 MRN: [id number]  Date of admission: 08/18/2015 Delivering MD: Jacklyn Shell   Date of discharge: 08/20/2015  Admitting diagnosis: LABOR & SROM Intrauterine pregnancy: [redacted]w[redacted]d     Secondary diagnosis: None     Discharge diagnosis: Term Pregnancy Delivered                                                                                                Post partum procedures:none  Augmentation: AROM and Pitocin  Complications: None  Hospital course:  Onset of Labor With Vaginal Delivery by Vacuum Ext due to fetal bradycardia  29 y.o. yo G1P1001 at [redacted]w[redacted]d was admitted in Latent Laboron 08/18/2015. Patient had an uncomplicated labor course as follows:  Membrane Rupture Time/Date: 3:55 PM ,08/18/2015   Intrapartum Procedures: Episiotomy: None [1]                                         Lacerations:  2nd degree [3];Perineal [11]  Patient had a delivery of a Viable infant. 08/18/2015  Information for the patient's newborn:  Lashaunda, Schild Girl Perlita [[id number]]  Delivery Method: Vaginal, Vacuum (Extractor) (Filed from Delivery Summary)    [name] had an uncomplicated postpartum course.  She is ambulating, tolerating a regular diet, passing flatus, and urinating well. Patient is discharged home in stable condition on No discharge date for patient encounter.Marland Kitchen    Physical exam  Filed Vitals:   08/19/15 0506 08/19/15 1228 08/19/15 1821 08/20/15 0632  BP: 116/65 125/72 110/64 112/64  Pulse: 87 101 85 84  Temp: 98.5 F (36.9 C) 98.2 F (36.8 C) 97.7 F (36.5 C) 98.2 F (36.8 C)  TempSrc: Oral Oral Oral Oral  Resp: Height:      Weight:      SpO2: 99% 99%     General: alert and cooperative Lochia: appropriate Uterine Fundus: firm Incision: N/A DVT Evaluation: No evidence of DVT seen on physical exam. Labs: Lab Results  Component Value Date   WBC 11.5* 08/18/2015   HGB 12.3 08/18/2015   HCT  36.1 08/18/2015   MCV 91.2 08/18/2015   PLT 171 08/18/2015   No flowsheet data found.  Discharge instruction: per After Visit Summary and "Baby and Me Booklet".  Medications:   Medication List    TAKE these medications        ibuprofen 600 MG tablet  Commonly known as:  ADVIL,MOTRIN  Take 1 tablet (600 mg total) by mouth every 6 (six) hours as needed.     norethindrone 0.35 MG tablet  Commonly known as:  ORTHO MICRONOR  Take 1 tablet (0.35 mg total) by mouth daily. Starting on 09/11/15  Start taking on:  09/11/2015     prenatal vitamin w/FE, FA 27-1 MG Tabs tablet  Take 1 tablet by mouth daily at 12 noon.        Diet: routine diet  Activity: Advance as tolerated. Pelvic rest for 6 weeks.  Outpatient follow up:6 weeks  Postpartum contraception: Progesterone only pills  Newborn Data: Live born female  Birth Weight: 8 lb 12.9 oz (3994 g) APGAR: 7, 9  Baby Feeding: Bottle and Breast, mostly breast Disposition:home with mother   08/20/2015 Cam Hai, CNM  9:24 AM

## 2015-08-22 ENCOUNTER — Ambulatory Visit (HOSPITAL_COMMUNITY): Payer: 59

## 2015-08-24 ENCOUNTER — Inpatient Hospital Stay (HOSPITAL_COMMUNITY): Payer: 59

## 2017-05-06 LAB — OB RESULTS CONSOLE ABO/RH: RH Type: POSITIVE

## 2017-05-06 LAB — OB RESULTS CONSOLE GC/CHLAMYDIA
CHLAMYDIA, DNA PROBE: NEGATIVE
GC PROBE AMP, GENITAL: NEGATIVE

## 2017-05-06 LAB — OB RESULTS CONSOLE RPR: RPR: NONREACTIVE

## 2017-05-06 LAB — OB RESULTS CONSOLE ANTIBODY SCREEN: ANTIBODY SCREEN: NEGATIVE

## 2017-05-06 LAB — OB RESULTS CONSOLE HEPATITIS B SURFACE ANTIGEN: Hepatitis B Surface Ag: NEGATIVE

## 2017-05-06 LAB — OB RESULTS CONSOLE HGB/HCT, BLOOD
HEMATOCRIT: 34
HEMOGLOBIN: 10.7

## 2017-05-06 LAB — OB RESULTS CONSOLE RUBELLA ANTIBODY, IGM: Rubella: IMMUNE

## 2017-05-06 LAB — OB RESULTS CONSOLE HIV ANTIBODY (ROUTINE TESTING): HIV: NONREACTIVE

## 2017-05-06 LAB — OB RESULTS CONSOLE PLATELET COUNT: PLATELETS: 177

## 2017-08-01 LAB — OB RESULTS CONSOLE HIV ANTIBODY (ROUTINE TESTING): HIV: NONREACTIVE

## 2017-08-01 LAB — OB RESULTS CONSOLE HGB/HCT, BLOOD
HCT: 33
Hemoglobin: 10.9

## 2017-08-01 LAB — OB RESULTS CONSOLE RPR: RPR: NONREACTIVE

## 2017-08-20 ENCOUNTER — Encounter: Payer: BLUE CROSS/BLUE SHIELD | Attending: Obstetrics and Gynecology | Admitting: *Deleted

## 2017-08-20 ENCOUNTER — Encounter: Payer: Self-pay | Admitting: *Deleted

## 2017-08-20 ENCOUNTER — Ambulatory Visit: Payer: Medicaid Other | Admitting: *Deleted

## 2017-08-20 DIAGNOSIS — O0993 Supervision of high risk pregnancy, unspecified, third trimester: Secondary | ICD-10-CM | POA: Insufficient documentation

## 2017-08-20 DIAGNOSIS — Z713 Dietary counseling and surveillance: Secondary | ICD-10-CM | POA: Diagnosis not present

## 2017-08-20 DIAGNOSIS — R7309 Other abnormal glucose: Secondary | ICD-10-CM

## 2017-08-20 DIAGNOSIS — R7302 Impaired glucose tolerance (oral): Secondary | ICD-10-CM | POA: Diagnosis not present

## 2017-08-20 NOTE — Progress Notes (Signed)
  Patient was seen on 08/20/2017 for Gestational Diabetes self-management . Patient states no history of GDM. EDD: 10/16/2017. Diet history obtained. 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 The following learning objectives were met by the patient :   States the definition of Gestational Diabetes  States why dietary management is important in controlling blood glucose  Describes the effects of carbohydrates on blood glucose levels  Demonstrates ability to create a balanced meal plan  Demonstrates carbohydrate counting   States when to check blood glucose levels  Demonstrates proper blood glucose monitoring techniques  States the effect of stress and exercise on blood glucose levels  States the importance of limiting caffeine and abstaining from alcohol and smoking  Plan:  Aim for 3 Carb Choices per meal (45 grams) +/- 1 either way  Aim for 1-2 Carbs per snack Begin reading food labels for Total Carbohydrate of foods Consider  increasing your activity level by walking or other activity daily as tolerated Begin checking BG before breakfast and 2 hours after first bite of breakfast, lunch and dinner as directed by MD  Bring Log Book to every medical appointment   Take medication if directed by MD  Blood glucose monitor Rx called into pharmacy: Accu Check Guide with Fast Clix drums Patient instructed to test pre breakfast and 2 hours each meal as directed by MD  Patient informed of Babyscripts but she prefers to write down BG on Log Sheet for now.  Patient instructed to monitor glucose levels: FBS: 60 - 95 mg/dl 2 hour: <120 mg/dl  Patient received the following handouts:  Nutrition Diabetes and Pregnancy  Carbohydrate Counting List  BG Log Sheet  Patient will be seen for follow-up as needed.

## 2017-08-26 ENCOUNTER — Other Ambulatory Visit: Payer: Self-pay

## 2017-08-26 DIAGNOSIS — R7302 Impaired glucose tolerance (oral): Secondary | ICD-10-CM | POA: Insufficient documentation

## 2017-08-26 MED ORDER — GLUCOSE BLOOD VI STRP
ORAL_STRIP | 12 refills | Status: DC
Start: 2017-08-26 — End: 2017-10-14

## 2017-08-26 MED ORDER — ACCU-CHEK FASTCLIX LANCETS MISC
1.0000 | Freq: Four times a day (QID) | 3 refills | Status: DC
Start: 2017-08-26 — End: 2017-10-14

## 2017-08-26 MED ORDER — ACCU-CHEK GUIDE W/DEVICE KIT: 1.0000 | PACK | Freq: Four times a day (QID) | 0 refills | Status: DC

## 2017-08-27 ENCOUNTER — Encounter: Payer: Self-pay | Admitting: Family Medicine

## 2017-08-27 ENCOUNTER — Ambulatory Visit (INDEPENDENT_AMBULATORY_CARE_PROVIDER_SITE_OTHER): Payer: Medicaid Other | Admitting: Family Medicine

## 2017-08-27 VITALS — BP 113/70 | HR 101 | Wt 204.0 lb

## 2017-08-27 DIAGNOSIS — O0993 Supervision of high risk pregnancy, unspecified, third trimester: Secondary | ICD-10-CM

## 2017-08-27 DIAGNOSIS — O24419 Gestational diabetes mellitus in pregnancy, unspecified control: Secondary | ICD-10-CM | POA: Insufficient documentation

## 2017-08-27 DIAGNOSIS — O24410 Gestational diabetes mellitus in pregnancy, diet controlled: Secondary | ICD-10-CM | POA: Diagnosis not present

## 2017-08-27 NOTE — Patient Instructions (Signed)

## 2017-08-27 NOTE — Progress Notes (Signed)
   PRENATAL VISIT NOTE  Subjective:  Alicia Avila is a 31 y.o. G2P1001 at [redacted]w[redacted]d being seen today for new prenatal care,. Transferred from [facility].  She is currently monitored for the following issues for this high-risk pregnancy and has Active labor at term; Impaired glucose tolerance; and Supervision of high risk pregnancy, antepartum, third trimester on her problem list.  Patient reports no complaints.  Contractions: Irregular.  .  Movement: Present. Denies leaking of fluid.   The following portions of the patient's history were reviewed and updated as appropriate: allergies, current medications, past family history, past medical history, past social history, past surgical history and problem list. Problem list updated.  Past Medical History:  Diagnosis Date  . Medical history non-contributory    Social History   Social History  . Marital status: Married    Spouse name: N/A  . Number of children: N/A  . Years of education: N/A   Social History Main Topics  . Smoking status: Never Smoker  . Smokeless tobacco: None  . Alcohol use No  . Drug use: No  . Sexual activity: Yes    Birth control/ protection: None   Other Topics Concern  . None   Social History Narrative  . None    Objective:   Vitals:   08/27/17 0948  BP: 113/70  Pulse: (!) 101  Weight: 204 lb (92.5 kg)    Fetal Status: Fetal Heart Rate (bpm): 145 Fundal Height: 33 cm Movement: Present     General:  Alert, oriented and cooperative. Patient is in no acute distress.  Skin: Skin is warm and dry. No rash noted.   Cardiovascular: Normal heart rate noted  Respiratory: Normal respiratory effort, no problems with respiration noted  Abdomen: Soft, gravid, appropriate for gestational age.  Pain/Pressure: Present     Pelvic: Cervical exam deferred        Extremities: Normal range of motion.  Edema: Trace  Mental Status:  Normal mood and affect. Normal behavior. Normal judgment and thought content.   Assessment and  Plan:  Pregnancy: G2P1001 at [redacted]w[redacted]d based on LMP here for:  Diet controlled gestational diabetes mellitus (GDM) in third trimester  Supervision of high risk pregnancy, antepartum, third trimester  -monitor blood sugars at home - 28 week labs performed. Request records from health dept.  Preterm labor symptoms and general obstetric precautions including but not limited to vaginal bleeding, contractions, leaking of fluid and fetal movement were reviewed in detail with the patient. Please refer to After Visit Summary for other counseling recommendations.  Return in about 2 weeks (around 09/10/2017).   Rolm Bookbinder, DO

## 2017-09-03 ENCOUNTER — Encounter: Payer: Self-pay | Admitting: *Deleted

## 2017-09-11 ENCOUNTER — Ambulatory Visit (INDEPENDENT_AMBULATORY_CARE_PROVIDER_SITE_OTHER): Payer: Medicaid Other | Admitting: Obstetrics and Gynecology

## 2017-09-11 ENCOUNTER — Encounter: Payer: Self-pay | Admitting: *Deleted

## 2017-09-11 VITALS — BP 129/66 | HR 92 | Wt 205.6 lb

## 2017-09-11 DIAGNOSIS — O0993 Supervision of high risk pregnancy, unspecified, third trimester: Secondary | ICD-10-CM

## 2017-09-11 DIAGNOSIS — O24410 Gestational diabetes mellitus in pregnancy, diet controlled: Secondary | ICD-10-CM

## 2017-09-11 LAB — GLUCOSE, 3 HOUR

## 2017-09-11 LAB — DRUG SCREEN, URINE
DRUG SCREEN, URINE: NEGATIVE
Drug Screen, Urine: NEGATIVE

## 2017-09-11 LAB — GLUCOSE, 1 HOUR
GLUCOSE 1 HOUR: 140
Glucose 1 Hour: 125

## 2017-09-11 LAB — AFP TETRA: MSAFP: NEGATIVE

## 2017-09-11 NOTE — Progress Notes (Signed)
OB US appt scheduled for November 14th @ 1245.  Pt notified.

## 2017-09-11 NOTE — Addendum Note (Signed)
Addended by: Faythe CasaBELLAMY, Lawanna Cecere M on: 09/11/2017 04:51 PM   Modules accepted: Orders

## 2017-09-11 NOTE — Patient Instructions (Signed)
Third Trimester of Pregnancy The third trimester is from week 28 through week 40 (months 7 through 9). The third trimester is a time when the unborn baby (fetus) is growing rapidly. At the end of the ninth month, the fetus is about 20 inches in length and weighs 6-10 pounds. Body changes during your third trimester Your body will continue to go through many changes during pregnancy. The changes vary from woman to woman. During the third trimester:  Your weight will continue to increase. You can expect to gain 25-35 pounds (11-16 kg) by the end of the pregnancy.  You may begin to get stretch marks on your hips, abdomen, and breasts.  You may urinate more often because the fetus is moving lower into your pelvis and pressing on your bladder.  You may develop or continue to have heartburn. This is caused by increased hormones that slow down muscles in the digestive tract.  You may develop or continue to have constipation because increased hormones slow digestion and cause the muscles that push waste through your intestines to relax.  You may develop hemorrhoids. These are swollen veins (varicose veins) in the rectum that can itch or be painful.  You may develop swollen, bulging veins (varicose veins) in your legs.  You may have increased body aches in the pelvis, back, or thighs. This is due to weight gain and increased hormones that are relaxing your joints.  You may have changes in your hair. These can include thickening of your hair, rapid growth, and changes in texture. Some women also have hair loss during or after pregnancy, or hair that feels dry or thin. Your hair will most likely return to normal after your baby is born.  Your breasts will continue to grow and they will continue to become tender. A yellow fluid (colostrum) may leak from your breasts. This is the first milk you are producing for your baby.  Your belly button may stick out.  You may notice more swelling in your hands,  face, or ankles.  You may have increased tingling or numbness in your hands, arms, and legs. The skin on your belly may also feel numb.  You may feel short of breath because of your expanding uterus.  You may have more problems sleeping. This can be caused by the size of your belly, increased need to urinate, and an increase in your body's metabolism.  You may notice the fetus "dropping," or moving lower in your abdomen (lightening).  You may have increased vaginal discharge.  You may notice your joints feel loose and you may have pain around your pelvic bone.  What to expect at prenatal visits You will have prenatal exams every 2 weeks until week 36. Then you will have weekly prenatal exams. During a routine prenatal visit:  You will be weighed to make sure you and the baby are growing normally.  Your blood pressure will be taken.  Your abdomen will be measured to track your baby's growth.  The fetal heartbeat will be listened to.  Any test results from the previous visit will be discussed.  You may have a cervical check near your due date to see if your cervix has softened or thinned (effaced).  You will be tested for Group B streptococcus. This happens between 35 and 37 weeks.  Your health care provider may ask you:  What your birth plan is.  How you are feeling.  If you are feeling the baby move.  If you have had   any abnormal symptoms, such as leaking fluid, bleeding, severe headaches, or abdominal cramping.  If you are using any tobacco products, including cigarettes, chewing tobacco, and electronic cigarettes.  If you have any questions.  Other tests or screenings that may be performed during your third trimester include:  Blood tests that check for low iron levels (anemia).  Fetal testing to check the health, activity level, and growth of the fetus. Testing is done if you have certain medical conditions or if there are problems during the  pregnancy.  Nonstress test (NST). This test checks the health of your baby to make sure there are no signs of problems, such as the baby not getting enough oxygen. During this test, a belt is placed around your belly. The baby is made to move, and its heart rate is monitored during movement.  What is false labor? False labor is a condition in which you feel small, irregular tightenings of the muscles in the womb (contractions) that usually go away with rest, changing position, or drinking water. These are called Braxton Hicks contractions. Contractions may last for hours, days, or even weeks before true labor sets in. If contractions come at regular intervals, become more frequent, increase in intensity, or become painful, you should see your health care provider. What are the signs of labor?  Abdominal cramps.  Regular contractions that start at 10 minutes apart and become stronger and more frequent with time.  Contractions that start on the top of the uterus and spread down to the lower abdomen and back.  Increased pelvic pressure and dull back pain.  A watery or bloody mucus discharge that comes from the vagina.  Leaking of amniotic fluid. This is also known as your "water breaking." It could be a slow trickle or a gush. Let your health care provider know if it has a color or strange odor. If you have any of these signs, call your health care provider right away, even if it is before your due date. Follow these instructions at home: Medicines  Follow your health care provider's instructions regarding medicine use. Specific medicines may be either safe or unsafe to take during pregnancy.  Take a prenatal vitamin that contains at least 600 micrograms (mcg) of folic acid.  If you develop constipation, try taking a stool softener if your health care provider approves. Eating and drinking  Eat a balanced diet that includes fresh fruits and vegetables, whole grains, good sources of protein  such as meat, eggs, or tofu, and low-fat dairy. Your health care provider will help you determine the amount of weight gain that is right for you.  Avoid raw meat and uncooked cheese. These carry germs that can cause birth defects in the baby.  If you have low calcium intake from food, talk to your health care provider about whether you should take a daily calcium supplement.  Eat four or five small meals rather than three large meals a day.  Limit foods that are high in fat and processed sugars, such as fried and sweet foods.  To prevent constipation: ? Drink enough fluid to keep your urine clear or pale yellow. ? Eat foods that are high in fiber, such as fresh fruits and vegetables, whole grains, and beans. Activity  Exercise only as directed by your health care provider. Most women can continue their usual exercise routine during pregnancy. Try to exercise for 30 minutes at least 5 days a week. Stop exercising if you experience uterine contractions.  Avoid heavy   lifting.  Do not exercise in extreme heat or humidity, or at high altitudes.  Wear low-heel, comfortable shoes.  Practice good posture.  You may continue to have sex unless your health care provider tells you otherwise. Relieving pain and discomfort  Take frequent breaks and rest with your legs elevated if you have leg cramps or low back pain.  Take warm sitz baths to soothe any pain or discomfort caused by hemorrhoids. Use hemorrhoid cream if your health care provider approves.  Wear a good support bra to prevent discomfort from breast tenderness.  If you develop varicose veins: ? Wear support pantyhose or compression stockings as told by your healthcare provider. ? Elevate your feet for 15 minutes, 3-4 times a day. Prenatal care  Write down your questions. Take them to your prenatal visits.  Keep all your prenatal visits as told by your health care provider. This is important. Safety  Wear your seat belt at  all times when driving.  Make a list of emergency phone numbers, including numbers for family, friends, the hospital, and police and fire departments. General instructions  Avoid cat litter boxes and soil used by cats. These carry germs that can cause birth defects in the baby. If you have a cat, ask someone to clean the litter box for you.  Do not travel far distances unless it is absolutely necessary and only with the approval of your health care provider.  Do not use hot tubs, steam rooms, or saunas.  Do not drink alcohol.  Do not use any products that contain nicotine or tobacco, such as cigarettes and e-cigarettes. If you need help quitting, ask your health care provider.  Do not use any medicinal herbs or unprescribed drugs. These chemicals affect the formation and growth of the baby.  Do not douche or use tampons or scented sanitary pads.  Do not cross your legs for long periods of time.  To prepare for the arrival of your baby: ? Take prenatal classes to understand, practice, and ask questions about labor and delivery. ? Make a trial run to the hospital. ? Visit the hospital and tour the maternity area. ? Arrange for maternity or paternity leave through employers. ? Arrange for family and friends to take care of pets while you are in the hospital. ? Purchase a rear-facing car seat and make sure you know how to install it in your car. ? Pack your hospital bag. ? Prepare the baby's nursery. Make sure to remove all pillows and stuffed animals from the baby's crib to prevent suffocation.  Visit your dentist if you have not gone during your pregnancy. Use a soft toothbrush to brush your teeth and be gentle when you floss. Contact a health care provider if:  You are unsure if you are in labor or if your water has broken.  You become dizzy.  You have mild pelvic cramps, pelvic pressure, or nagging pain in your abdominal area.  You have lower back pain.  You have persistent  nausea, vomiting, or diarrhea.  You have an unusual or bad smelling vaginal discharge.  You have pain when you urinate. Get help right away if:  Your water breaks before 37 weeks.  You have regular contractions less than 5 minutes apart before 37 weeks.  You have a fever.  You are leaking fluid from your vagina.  You have spotting or bleeding from your vagina.  You have severe abdominal pain or cramping.  You have rapid weight loss or weight gain.    You have shortness of breath with chest pain.  You notice sudden or extreme swelling of your face, hands, ankles, feet, or legs.  Your baby makes fewer than 10 movements in 2 hours.  You have severe headaches that do not go away when you take medicine.  You have vision changes. Summary  The third trimester is from week 28 through week 40, months 7 through 9. The third trimester is a time when the unborn baby (fetus) is growing rapidly.  During the third trimester, your discomfort may increase as you and your baby continue to gain weight. You may have abdominal, leg, and back pain, sleeping problems, and an increased need to urinate.  During the third trimester your breasts will keep growing and they will continue to become tender. A yellow fluid (colostrum) may leak from your breasts. This is the first milk you are producing for your baby.  False labor is a condition in which you feel small, irregular tightenings of the muscles in the womb (contractions) that eventually go away. These are called Braxton Hicks contractions. Contractions may last for hours, days, or even weeks before true labor sets in.  Signs of labor can include: abdominal cramps; regular contractions that start at 10 minutes apart and become stronger and more frequent with time; watery or bloody mucus discharge that comes from the vagina; increased pelvic pressure and dull back pain; and leaking of amniotic fluid. This information is not intended to replace advice  given to you by your health care provider. Make sure you discuss any questions you have with your health care provider. Document Released: 10/30/2001 Document Revised: 04/12/2016 Document Reviewed: 01/06/2013 Elsevier Interactive Patient Education  2017 [employer]  

## 2017-09-11 NOTE — Progress Notes (Signed)
   PRENATAL VISIT NOTE  Subjective:  Alicia Avila is a 31 y.o. G2P1001 at 6637w0d being seen today for ongoing prenatal care.  She is currently monitored for the following issues for this low-risk pregnancy and has Impaired glucose tolerance; Supervision of high risk pregnancy, antepartum, third trimester; and Gestational diabetes mellitus on her problem list.  Patient reports occasional contractions.  Contractions: Irregular. Vag. Bleeding: None.  Movement: Present. Denies leaking of fluid.   gDMA1 Fasting < 95 PP: mostly < 115   The following portions of the patient's history were reviewed and updated as appropriate: allergies, current medications, past family history, past medical history, past social history, past surgical history and problem list. Problem list updated.  Objective:   Vitals:   09/11/17 1606  BP: 129/66  Pulse: 92  Weight: 205 lb 9.6 oz (93.3 kg)    Fetal Status: Fetal Heart Rate (bpm): 147 Fundal Height: 36 cm Movement: Present     General:  Alert, oriented and cooperative. Patient is in no acute distress.  Skin: Skin is warm and dry. No rash noted.   Cardiovascular: Normal heart rate noted  Respiratory: Normal respiratory effort, no problems with respiration noted  Abdomen: Soft, gravid, appropriate for gestational age.  Pain/Pressure: Present     Pelvic: Cervical exam deferred        Extremities: Normal range of motion.  Edema: Trace  Mental Status:  Normal mood and affect. Normal behavior. Normal judgment and thought content.   Assessment and Plan:  Pregnancy: G2P1001 at 4237w0d  1. Supervision of high risk pregnancy, antepartum, third trimester No acute issues today  2. Diet controlled gestational diabetes mellitus (GDM), antepartum Well controlled, cont diet control Growth US ordered   Preterm labor symptoms and general obstetric precautions including but not limited to vaginal bleeding, contractions, leaking of fluid and fetal movement were  reviewed in detail with the patient. Please refer to After Visit Summary for other counseling recommendations.  Return in about 1 week (around 09/18/2017) for OB visit (MD).   Conan BowensKelly M Aki Burdin, MD

## 2017-09-18 ENCOUNTER — Other Ambulatory Visit (HOSPITAL_COMMUNITY)
Admission: RE | Admit: 2017-09-18 | Discharge: 2017-09-18 | Disposition: A | Discharge status: Home / Self Care | Payer: BLUE CROSS/BLUE SHIELD | Source: Ambulatory Visit | Attending: Family Medicine | Admitting: Family Medicine

## 2017-09-18 ENCOUNTER — Ambulatory Visit (INDEPENDENT_AMBULATORY_CARE_PROVIDER_SITE_OTHER): Payer: Medicaid Other | Admitting: Family Medicine

## 2017-09-18 VITALS — BP 125/64 | HR 93 | Wt 207.1 lb

## 2017-09-18 DIAGNOSIS — O0993 Supervision of high risk pregnancy, unspecified, third trimester: Secondary | ICD-10-CM | POA: Diagnosis not present

## 2017-09-18 DIAGNOSIS — O24410 Gestational diabetes mellitus in pregnancy, diet controlled: Secondary | ICD-10-CM | POA: Diagnosis not present

## 2017-09-18 DIAGNOSIS — Z3A36 36 weeks gestation of pregnancy: Secondary | ICD-10-CM | POA: Insufficient documentation

## 2017-09-18 DIAGNOSIS — Z113 Encounter for screening for infections with a predominantly sexual mode of transmission: Secondary | ICD-10-CM | POA: Diagnosis not present

## 2017-09-18 LAB — OB RESULTS CONSOLE GBS: GBS: POSITIVE

## 2017-09-18 NOTE — Addendum Note (Signed)
Addended by: Lorelle GibbsWILSON, Lorrain Rivers L on: 09/18/2017 03:18 PM   Modules accepted: Orders

## 2017-09-18 NOTE — Progress Notes (Signed)
Subjective:  Alicia PurserLeslie Cicalese is a 31 y.o. G2P1001 at 7260w0d being seen today for ongoing prenatal care.  She is currently monitored for the following issues for this high-risk pregnancy and has Impaired glucose tolerance; Supervision of high risk pregnancy, antepartum, third trimester; and Gestational diabetes mellitus on her problem list.  GDM: A1. Checking twice a day. Fasting: 82-95 2hr PP: controlled after lunch, occasional check after lunch  Patient reports no complaints.  Contractions: Irregular. Vag. Bleeding: None.  Movement: Present. Denies leaking of fluid.   The following portions of the patient's history were reviewed and updated as appropriate: allergies, current medications, past family history, past medical history, past social history, past surgical history and problem list. Problem list updated.  Objective:   Vitals:   09/18/17 1456  BP: 125/64  Pulse: 93  Weight: 207 lb 1.6 oz (93.9 kg)    Fetal Status: Fetal Heart Rate (bpm): 132   Movement: Present  Presentation: Vertex  General:  Alert, oriented and cooperative. Patient is in no acute distress.  Skin: Skin is warm and dry. No rash noted.   Cardiovascular: Normal heart rate noted  Respiratory: Normal respiratory effort, no problems with respiration noted  Abdomen: Soft, gravid, appropriate for gestational age. Pain/Pressure: Present     Pelvic: Vag. Bleeding: None     Cervical exam deferred Dilation: 1 Effacement (%): Thick Station: -3  Extremities: Normal range of motion.  Edema: Trace  Mental Status: Normal mood and affect. Normal behavior. Normal judgment and thought content.   Urinalysis:      Assessment and Plan:  Pregnancy: G2P1001 at 5660w0d  1. Supervision of high risk pregnancy, antepartum, third trimester FHT and Fh normal  2. Diet controlled gestational diabetes mellitus (GDM), antepartum Check twice daily - will vary time   Preterm labor symptoms and general obstetric precautions including but  not limited to vaginal bleeding, contractions, leaking of fluid and fetal movement were reviewed in detail with the patient. Please refer to After Visit Summary for other counseling recommendations.  Return in about 1 week (around 09/25/2017) for HR OB f/u.   Levie HeritageStinson, Sharia Averitt J, DO

## 2017-09-19 LAB — CERVICOVAGINAL ANCILLARY ONLY
Chlamydia: NEGATIVE
Neisseria Gonorrhea: NEGATIVE

## 2017-09-21 LAB — CULTURE, BETA STREP (GROUP B ONLY): Strep Gp B Culture: POSITIVE — AB

## 2017-09-25 ENCOUNTER — Ambulatory Visit (INDEPENDENT_AMBULATORY_CARE_PROVIDER_SITE_OTHER): Payer: Medicaid Other | Admitting: Obstetrics and Gynecology

## 2017-09-25 ENCOUNTER — Encounter: Payer: Self-pay | Admitting: Obstetrics and Gynecology

## 2017-09-25 ENCOUNTER — Encounter (HOSPITAL_COMMUNITY): Payer: Self-pay | Admitting: Obstetrics and Gynecology

## 2017-09-25 VITALS — BP 123/68 | HR 91 | Wt 207.5 lb

## 2017-09-25 DIAGNOSIS — O0993 Supervision of high risk pregnancy, unspecified, third trimester: Secondary | ICD-10-CM

## 2017-09-25 DIAGNOSIS — O24410 Gestational diabetes mellitus in pregnancy, diet controlled: Secondary | ICD-10-CM

## 2017-09-25 LAB — POCT URINALYSIS DIP (DEVICE)
Bilirubin Urine: NEGATIVE
Glucose, UA: NEGATIVE mg/dL
KETONES UR: NEGATIVE mg/dL
Nitrite: NEGATIVE
PROTEIN: NEGATIVE mg/dL
SPECIFIC GRAVITY, URINE: 1.025 (ref 1.005–1.030)
Urobilinogen, UA: 0.2 mg/dL (ref 0.0–1.0)
pH: 6.5 (ref 5.0–8.0)

## 2017-09-25 NOTE — Progress Notes (Signed)
   PRENATAL VISIT NOTE  Subjective:  Alicia Avila is a 31 y.o. G2P1001 at 7461w0d being seen today for ongoing prenatal care.  She is currently monitored for the following issues for this high-risk pregnancy and has Impaired glucose tolerance; Supervision of high risk pregnancy, antepartum, third trimester; and Gestational diabetes mellitus on their problem list.  Patient reports no complaints.  Contractions: Irregular. Vag. Bleeding: None.  Movement: Present. Denies leaking of fluid.   The following portions of the patient's history were reviewed and updated as appropriate: allergies, current medications, past family history, past medical history, past social history, past surgical history and problem list. Problem list updated.  Objective:   Vitals:   09/25/17 1309  BP: 123/68  Pulse: 91  Weight: 207 lb 8 oz (94.1 kg)    Fetal Status: Fetal Heart Rate (bpm): 168 Fundal Height: 39 cm Movement: Present     General:  Alert, oriented and cooperative. Patient is in no acute distress.  Skin: Skin is warm and dry. No rash noted.   Cardiovascular: Normal heart rate noted  Respiratory: Normal respiratory effort, no problems with respiration noted  Abdomen: Soft, gravid, appropriate for gestational age.  Pain/Pressure: Present     Pelvic: Cervical exam deferred        Extremities: Normal range of motion.  Edema: Trace  Mental Status:  Normal mood and affect. Normal behavior. Normal judgment and thought content.   Assessment and Plan:  Pregnancy: G2P1001 at 3961w0d  1. Supervision of high risk pregnancy, antepartum, third trimester Patient is doing well without complaints Patient is still researching pediatricians  2. Diet controlled gestational diabetes mellitus (GDM), antepartum CBGs reviewed and all well controlled Continue diet control Growth ultrasound 11/14 Plan for IOL by 40 weeks  Term labor symptoms and general obstetric precautions including but not limited to vaginal  bleeding, contractions, leaking of fluid and fetal movement were reviewed in detail with the patient. Please refer to After Visit Summary for other counseling recommendations.  Return in about 1 week (around 10/02/2017) for ROB.   Catalina AntiguaPeggy Briah Nary, MD

## 2017-10-02 ENCOUNTER — Encounter (HOSPITAL_COMMUNITY): Payer: Self-pay

## 2017-10-02 ENCOUNTER — Ambulatory Visit (INDEPENDENT_AMBULATORY_CARE_PROVIDER_SITE_OTHER): Payer: BLUE CROSS/BLUE SHIELD | Admitting: Obstetrics & Gynecology

## 2017-10-02 ENCOUNTER — Ambulatory Visit (HOSPITAL_COMMUNITY)
Admission: RE | Admit: 2017-10-02 | Discharge: 2017-10-02 | Disposition: A | Discharge status: Home / Self Care | Payer: BLUE CROSS/BLUE SHIELD | Source: Ambulatory Visit | Attending: Obstetrics and Gynecology | Admitting: Obstetrics and Gynecology

## 2017-10-02 VITALS — BP 118/65 | HR 88 | Wt 206.4 lb

## 2017-10-02 DIAGNOSIS — Z3689 Encounter for other specified antenatal screening: Secondary | ICD-10-CM | POA: Diagnosis not present

## 2017-10-02 DIAGNOSIS — O24410 Gestational diabetes mellitus in pregnancy, diet controlled: Secondary | ICD-10-CM

## 2017-10-02 DIAGNOSIS — Z3A38 38 weeks gestation of pregnancy: Secondary | ICD-10-CM | POA: Insufficient documentation

## 2017-10-02 DIAGNOSIS — O321XX0 Maternal care for breech presentation, not applicable or unspecified: Secondary | ICD-10-CM | POA: Insufficient documentation

## 2017-10-02 DIAGNOSIS — O0993 Supervision of high risk pregnancy, unspecified, third trimester: Secondary | ICD-10-CM

## 2017-10-02 NOTE — Patient Instructions (Addendum)
Scheduled for Version at 8 am tomorrow morning 10/03/2017   Moxibustion is alternative, cesarean delivery is other alternative.   Breech Birth What is a breech birth? A breech birth is when a baby is born with the buttocks or the feet first. Most babies are in a head down (vertex) position when they are born. There are three types of breech babies:  When the baby's buttocks are showing first in the birth canal (vagina) with the legs straight up and the feet at the baby's head (frank breech).  When the baby's buttocks shows first with the legs bent at the knees and the feet down near the buttocks (complete breech).  When one or both of the baby's feet are down below the buttocks (footling breech).  What are the risks of a breech birth? Having a breech birth increases the risk to your baby. A breech birth may cause the following:  Umbilical cord prolapse. This is when the umbilical cord is in front of the baby before or during labor. This can cause the cord to become pinched or compressed. This can reduce the flow of blood and oxygen to the baby.  The baby getting stuck in the birth canal, which can cause injury or, rarely, death.  Injury to the nerves in the shoulder, arm, and hand (brachial plexus injury) when delivered.  Your baby being born too early (prematurely).  An increased need for a cesarean delivery.  What increases the risk of having a breech baby? It is not known what causes your baby to be breech. However, risk factors that may increase your chances of having a breech baby include the following:  The mother having had several babies already.  The mother having twins or more.  The mother having a baby with certain congenital disabilities.  The mother going into labor early.  The mother having problems with her uterus, such as a tumor.  The mother having placenta problems (placenta previa) or too much or not enough fluid surrounding the baby (amniotic  fluid).  How do I know if my baby is breech? There are no symptoms for you to know that your baby is breech. When you are close to your due date, your health care provider can tell if your baby is breech by:  An abdominal or vaginal (pelvic) exam.  An ultrasound.  Your health care provider may also be able to tell that your baby is breech if your baby's heartbeat is heard above your belly button. What can be done if my baby is breech?  Your health care provider may try to turn the baby in your uterus. This is a procedure called external cephalic version (ECV). This is done by your health care provider. He or she will place both hands on your abdomen and gently and slowly turn the baby around. It is important to know that ECV can increase your chances of suddenly going into labor. If an ECV is done, it is done toward the end of a healthy pregnancy. The baby may remain in this position or he or she may turn back to the breech position. You and your health care provider will discuss if an ECV is recommended for you and your baby. How will I delivery my baby if my baby is breech? You and your health care provider will discuss the best way to deliver your baby. If your baby is breech, it is less likely that a vaginal delivery will be recommended due to the risks. Some breech  babies may be delivered safely without a cesarean, while in other cases health care providers will recommend a cesarean delivery. This information is not intended to replace advice given to you by your health care provider. Make sure you discuss any questions you have with your health care provider. Document Released: 12/27/2006 Document Revised: 10/22/2016 Document Reviewed: 09/09/2014 Elsevier Interactive Patient Education  2017 [employer]

## 2017-10-02 NOTE — Progress Notes (Signed)
PRENATAL VISIT NOTE  Subjective:  Alicia Avila is a 31 y.o. G2P1001 at [redacted]w[redacted]d being seen today for ongoing prenatal care.  She is currently monitored for the following issues for this high-risk pregnancy and has Impaired glucose tolerance; Supervision of high risk pregnancy, antepartum, third trimester; and Gestational diabetes mellitus on their problem list.  Patient reports no complaints.  Contractions: Irregular. Vag. Bleeding: None.  Movement: Present. Denies leaking of fluid.   The following portions of the patient's history were reviewed and updated as appropriate: allergies, current medications, past family history, past medical history, past social history, past surgical history and problem list. Problem list updated.  Objective:   Vitals:   10/02/17 1439  BP: 118/65  Pulse: 88  Weight: 206 lb 6.4 oz (93.6 kg)    Fetal Status: Fetal Heart Rate (bpm): 134   Movement: Present  Presentation: Homero Fellers Breech  General:  Alert, oriented and cooperative. Patient is in no acute distress.  Skin: Skin is warm and dry. No rash noted.   Cardiovascular: Normal heart rate noted  Respiratory: Normal respiratory effort, no problems with respiration noted  Abdomen: Soft, gravid, appropriate for gestational age.  Pain/Pressure: Present     Pelvic: Cervical exam deferred        Extremities: Normal range of motion.  Edema: None  Mental Status:  Normal mood and affect. Normal behavior. Normal judgment and thought content.   [place] Mfm Ob Detail +14 Wk  Result Date: 10/02/2017 ----------------------------------------------------------------------  OBSTETRICS REPORT                      (Signed Final 10/02/2017 02:05 pm) ---------------------------------------------------------------------- Patient Info  ID #:       [id number]                          D.O.B.:  1986/07/11 (31 yrs)  Name:       Alicia Avila                   Visit Date: 10/02/2017 01:25 pm  ---------------------------------------------------------------------- Performed By  Performed By:     Ellin Saba        Ref. Address:     [facility]                    [facility]                                                             [facility]                                                             [address]                                                             [address]  [place], [place]                                                             [id number]  Attending:        Particia NearingMartha Decker MD       [facility]  Referred By[facility]      [facility]                    [facility] ---------------------------------------------------------------------- Orders   #  Description                                 Code   1  US MFM OB DETAIL +14 WK                     [id number]  ----------------------------------------------------------------------   #  Ordered By               Order #        Accession #    Episode #   1  Leroy LibmanKELLY DAVIS              [id number]      [id number]     [id number]  ---------------------------------------------------------------------- Indications   [redacted] weeks gestation of pregnancy                Z3A.38   Gestational diabetes in pregnancy, diet        O24.410   controlled   Encounter for fetal anatomic survey            Z36.89  ---------------------------------------------------------------------- OB History  Gravidity:    2         Term:   1  Living:       1 ---------------------------------------------------------------------- Fetal Evaluation  Num Of Fetuses:     1  Fetal Heart         137  Rate(bpm):  Cardiac Activity:   Observed  Presentation:       Frank breech  Placenta:           Fundal, above cervical os  P. Cord Insertion:  Not well visualized  Amniotic Fluid  AFI FV:      Subjectively within normal limits  AFI Sum(cm)     %Tile       Largest  Pocket(cm)  15.24           59          7.74  RUQ(cm)                     LUQ(cm)        LLQ(cm)  7.74                        3              4.5 ---------------------------------------------------------------------- Biometry  BPD:      99.6  mm     G. Age:  40w 6d       > 99  %    CI:        76.72   %    70 - 86                                                          FL/HC:      21.0   %    20.9 - 22.7  HC:      360.2  mm     G. Age:  N/A          > 97  %    HC/AC:      1.05        0.92 - 1.05  AC:      344.5  mm     G. Age:  38w 2d         76  %    FL/BPD:     76.1   %    71 - 87  FL:       75.8  mm     G. Age:  38w 6d         71  %    FL/AC:      22.0   %    20 - 24  HUM:      65.2  mm     G. Age:  37w 6d         75  %  CER:      55.8  mm     G. Age:  N/A          > 95  %  Est. FW:    3724  gm      8 lb 3 oz   > 90  % ---------------------------------------------------------------------- Gestational Age  LMP:           38w 0d        Date:  01/09/17                 EDD:   10/16/17  U/S Today:     39w 2d                                        EDD:   10/07/17  Best:          38w 0d     Det. By:  LMP  (01/09/17)          EDD:   10/16/17 ---------------------------------------------------------------------- Anatomy  Cranium:               Appears normal         Aortic Arch:            Appears normal  Cavum:                 Appears normal         Ductal Arch:            Appears normal  Ventricles:            Appears normal         Diaphragm:  Appears normal  Choroid Plexus:        Appears normal         Stomach:                Appears normal, left                                                                        sided  Cerebellum:            Appears normal         Abdomen:                Appears normal  Posterior Fossa:       Appears normal         Abdominal Wall:         Appears nml (cord                                                                        insert, abd wall)  Nuchal Fold:            Appears normal         Cord Vessels:           Appears normal (3                                                                        vessel cord)  Face:                  Appears normal         Kidneys:                Appear normal                         (orbits and profile)  Lips:                  Appears normal         Bladder:                Appears normal  Thoracic:              Appears normal         Spine:                  Appears normal  Heart:                 Appears normal         Upper Extremities:      Appears normal                         (4CH,  axis, and                                LTD LLE                         situs)  RVOT:                  Appears normal         Lower Extremities:      Appears normal  LVOT:                  Appears normal  Other:  Female gender. Nasal bone visualized. Technically difficult due to          maternal habitus and fetal position. ---------------------------------------------------------------------- Cervix Uterus Adnexa  Cervix  Not visualized (advanced GA >29wks)  Left Ovary  Within normal limits.  Right Ovary  Within normal limits. ---------------------------------------------------------------------- Impression  SIUP at 38+0 weeks  Homero Fellers breech presentation  Normal detailed fetal anatomy  Normal amniotic fluid volume  Measurements consistent with LMP dating; EFW > 90th  %tile; AC > 97th %tile; 3724 grams; 8+3 ---------------------------------------------------------------------- Recommendations  Follow-up as clinically indicated ----------------------------------------------------------------------                 Particia Nearing, MD Electronically Signed Final Report   10/02/2017 02:05 pm ----------------------------------------------------------------------   Assessment and Plan:  Pregnancy: G2P1001 at [redacted]w[redacted]d  1. Diet controlled gestational diabetes mellitus (GDM) in third trimester Stable BS, continue diet control  2. Frank breech presentation, single or  unspecified fetus Discussed implications of breech presentation; discussed that the fetus can spontaneously turn to cephalic presentation.  Other interventions could be external cephalic version which is done in the hospital vs moxibustion vs planned cesarean delivery. Risks/benefits of all modalities discussed in detail. Patient desires version, this was scheduled for tomorrow 10/03/17 at 8 am . Information was given to her to review at home.   3. Supervision of high risk pregnancy, antepartum, third trimester Term labor symptoms and general obstetric precautions including but not limited to vaginal bleeding, contractions, leaking of fluid and fetal movement were reviewed in detail with the patient. Please refer to After Visit Summary for other counseling recommendations.  Return in about 1 week (around 10/09/2017) for OB Visit (HOB).   Jaynie Collins, MD

## 2017-10-03 ENCOUNTER — Observation Stay (HOSPITAL_COMMUNITY)
Admission: RE | Admit: 2017-10-03 | Discharge: 2017-10-03 | Disposition: A | Discharge status: Home / Self Care | Payer: BLUE CROSS/BLUE SHIELD | Source: Ambulatory Visit | Attending: Obstetrics & Gynecology | Admitting: Obstetrics & Gynecology

## 2017-10-03 ENCOUNTER — Encounter (HOSPITAL_COMMUNITY): Payer: Self-pay

## 2017-10-03 DIAGNOSIS — O24419 Gestational diabetes mellitus in pregnancy, unspecified control: Secondary | ICD-10-CM | POA: Diagnosis not present

## 2017-10-03 DIAGNOSIS — Z3A38 38 weeks gestation of pregnancy: Secondary | ICD-10-CM | POA: Diagnosis not present

## 2017-10-03 DIAGNOSIS — O321XX0 Maternal care for breech presentation, not applicable or unspecified: Principal | ICD-10-CM | POA: Insufficient documentation

## 2017-10-03 MED ORDER — TERBUTALINE SULFATE 1 MG/ML IJ SOLN
0.2500 mg | Freq: Once | INTRAMUSCULAR | Status: AC
  Administered 2017-10-03: 0.25 mg via SUBCUTANEOUS

## 2017-10-03 MED ORDER — TERBUTALINE SULFATE 1 MG/ML IJ SOLN
0.2500 mg | Freq: Once | INTRAMUSCULAR | Status: AC
  Administered 2017-10-03: 0.25 mg via SUBCUTANEOUS
  Filled 2017-10-03: qty 1

## 2017-10-03 NOTE — H&P (Signed)
Obstetric History and Physical  Alicia Avila is a 31 y.o. G2P1001 with IUP at 62w1dpresenting for fetal breech presentation, plan for ECV.   : Patient Active Problem List   Diagnosis Date Noted  . Frank breech presentation 10/02/2017  . Supervision of high risk pregnancy, antepartum, third trimester 08/27/2017  . Gestational diabetes mellitus 08/27/2017  . Impaired glucose tolerance 08/26/2017     Past Medical History:  Diagnosis Date  . Medical history non-contributory     Past Surgical History:  Procedure Laterality Date  . NO PAST SURGERIES      OB History  Gravida Para Term Preterm AB Living  2 1 1     1   SAB TAB Ectopic Multiple Live Births        0 1    # Outcome Date GA Lbr Len/2nd Weight Sex Delivery Anes PTL Lv  2 Current           1 Term 08/18/15 426w1d6:26 / 00:34 8 lb 12.9 oz (3.994 kg) F Vag-Vacuum EPI  LIV     Birth Comments: none      Social History   Socioeconomic History  . Marital status: Married    Spouse name: None  . Number of children: None  . Years of education: None  . Highest education level: None  Social Needs  . Financial resource strain: None  . Food insecurity - worry: None  . Food insecurity - inability: None  . Transportation needs - medical: None  . Transportation needs - non-medical: None  Occupational History  . None  Tobacco Use  . Smoking status: Never Smoker  . Smokeless tobacco: Never Used  Substance and Sexual Activity  . Alcohol use: No  . Drug use: No  . Sexual activity: Yes    Birth control/protection: None  Other Topics Concern  . None  Social History Narrative  . None    History reviewed. No pertinent family history.  Medications Prior to Admission  Medication Sig Dispense Refill Last Dose  . ACCU-CHEK FASTCLIX LANCETS MISC 1 Device by Does not apply route 4 (four) times daily. 360 each 3 Taking  . Blood Glucose Monitoring Suppl (ACCU-CHEK GUIDE) w/Device KIT 1 Device by Does not apply route 4 (four)  times daily. 1 kit 0 Taking  . glucose blood (ACCU-CHEK GUIDE) test strip Use as instructed 100 each 12 Taking  . prenatal vitamin w/FE, FA (PRENATAL 1 + 1) 27-1 MG TABS tablet Take 1 tablet by mouth daily at 12 noon.   Taking    No Known Allergies  Review of Systems: Negative except for what is mentioned in HPI.  Physical Exam: LMP 01/09/2017 (Approximate)  CONSTITUTIONAL: Well-developed, well-nourished female in no acute distress.  HENT:  Normocephalic, atraumatic, External right and left ear normal. EYES: Conjunctivae and EOM are normal. NECK: Normal range of motion, supple, no masses SKIN: Skin is warm and dry. No rash noted. Not diaphoretic. No erythema. No pallor. NEUROLOGIC: Alert and oriented to person, place, and time.  PSYCHIATRIC: Normal mood and affect. Normal behavior. Normal judgment and thought content. CARDIOVASCULAR: Normal heart rate noted RESPIRATORY: Effort and breath sounds normal, no problems with respiration noted ABDOMEN: Soft, nontender, nondistended, gravid. MUSCULOSKELETAL: Normal range of motion. No edema and no tenderness. 2+ distal pulses.   Assessment : Alicia Avila a [age].o. G2P1001 at 38110w1ding admitted for fetal breech presentation.  Plan: ECV. Risks and benefits discussed with the patient.   AmbDannielle HuhO

## 2017-10-03 NOTE — Procedures (Signed)
After informed verbal consent, Terbutaline 0.25 mg SQ given. US showed baby is frank breech position. ECV was attempted under Ultrasound guidance.  2nd dose terbutaline 0.25 mg SQ given. Continued to attempt ECV but unsuccessful.    FHR was reactive before and after the procedure.   Pt. Tolerated the procedure well. Monitor patient and EFM for 30 minutes following procedure.  Alexa Blish, [facility], DO 10/03/2017, 9:33 AM

## 2017-10-03 NOTE — Discharge Summary (Signed)
  The above patient was seen on the Birthing suits for an outpatient procedure: external cephalic version, which was unsuccessful.  The baby was monitored for 1 hour afterwards, showing reactive FHT baseline 160 bpm, with accelerations and moderate variability. Patient reported no bleeding or spotting or leaking fluid.  Patient will follow up in the clinic for continued prenatal care.  Khayree Delellis, DO 10/03/2017, 10:44 AM

## 2017-10-09 ENCOUNTER — Ambulatory Visit (INDEPENDENT_AMBULATORY_CARE_PROVIDER_SITE_OTHER): Payer: BLUE CROSS/BLUE SHIELD | Admitting: Obstetrics & Gynecology

## 2017-10-09 VITALS — BP 136/69 | HR 89 | Wt 210.4 lb

## 2017-10-09 DIAGNOSIS — O0993 Supervision of high risk pregnancy, unspecified, third trimester: Secondary | ICD-10-CM

## 2017-10-09 DIAGNOSIS — O24410 Gestational diabetes mellitus in pregnancy, diet controlled: Secondary | ICD-10-CM

## 2017-10-09 DIAGNOSIS — O321XX0 Maternal care for breech presentation, not applicable or unspecified: Secondary | ICD-10-CM

## 2017-10-09 NOTE — Patient Instructions (Addendum)
Return to clinic for any scheduled appointments or obstetric concerns, or go to [facility] for evaluation    Cesarean Delivery Cesarean birth, or cesarean delivery, is the surgical delivery of a baby through an incision in the abdomen and the uterus. This may be referred to as a C-section. This procedure may be scheduled ahead of time, or it may be done in an emergency situation. Tell a health care provider about:  Any allergies you have.  All medicines you are taking, including vitamins, herbs, eye drops, creams, and over-the-counter medicines.  Any problems you or family members have had with anesthetic medicines.  Any blood disorders you have.  Any surgeries you have had.  Any medical conditions you have.  Whether you or any members of your family have a history of deep vein thrombosis (DVT) or pulmonary embolism (PE). What are the risks? Generally, this is a safe procedure. However, problems may occur, including:  Infection.  Bleeding.  Allergic reactions to medicines.  Damage to other structures or organs.  Blood clots.  Injury to your baby.  What happens before the procedure?  Follow instructions from your health care provider about eating or drinking restrictions.  Follow instructions from your health care provider about bathing before your procedure to help reduce your risk of infection.  If you know that you are going to have a cesarean delivery, do not shave your pubic area. Shaving before the procedure may increase your risk of infection.  Ask your health care provider about: ? Changing or stopping your regular medicines. This is especially important if you are taking diabetes medicines or blood thinners. ? Your pain management plan. This is especially important if you plan to breastfeed your baby. ? How long you will be in the hospital after the procedure. ? Any concerns you may have about receiving blood products if you need them during the procedure. ? Cord  blood banking, if you plan to collect your baby's umbilical cord blood.  You may also want to ask your health care provider: ? Whether you will be able to hold or breastfeed your baby while you are still in the operating room. ? Whether your baby can stay with you immediately after the procedure and during your recovery. ? Whether a family member or a person of your choice can go with you into the operating room and stay with you during the procedure, immediately after the procedure, and during your recovery.  Plan to have someone drive you home when you are discharged from the hospital. What happens during the procedure?  Fetal monitors will be placed on your abdomen to monitor your heart rate and your baby's heart rate.  Depending on the reason for your cesarean delivery, you may have a physical exam or additional testing, such as an ultrasound.  An IV tube will be inserted into one of your veins.  You may have your blood or urine tested.  You will be given antibiotic medicine to help prevent infection.  You may be given a special warming gown to wear to keep your temperature stable.  Hair may be removed from your pubic area.  The skin of your pubic area and lower abdomen will be cleaned with a germ-killing solution (antiseptic).  A catheter may be inserted into your bladder through your urethra. This drains your urine during the procedure.  You may be given one or more of the following: ? A medicine to numb the area (local anesthetic). ? A medicine to make you  fall asleep (general anesthetic). ? A medicine (regional anesthetic) that is injected into your back or through a small thin tube placed in your back (spinal anesthetic or epidural anesthetic). This numbs everything below the injection site and allows you to stay awake during your procedure. If this makes you feel nauseous, tell your health care provider. Medicines will be available to help reduce any nausea you may  feel.  An incision will be made in your abdomen, and then in your uterus.  If you are awake during your procedure, you may feel tugging and pulling in your abdomen, but you should not feel pain. If you feel pain, tell your health care provider immediately.  Your baby will be removed from your uterus. You may feel more pressure or pushing while this happens.  Immediately after birth, your baby will be dried and kept warm. You may be able to hold and breastfeed your baby. The umbilical cord may be clamped and cut during this time.  Your placenta will be removed from your uterus.  Your incisions will be closed with stitches (sutures). Staples, skin glue, or adhesive strips may also be applied to the incision in your abdomen.  Bandages (dressings) will be placed over the incision in your abdomen. The procedure may vary among health care providers and hospitals. What happens after the procedure?  Your blood pressure, heart rate, breathing rate, and blood oxygen level will be monitored often until the medicines you were given have worn off.  You may continue to receive fluids and medicines through an IV tube.  You will have some pain. Medicines will be available to help control your pain.  To help prevent blood clots: ? You may be given medicines. ? You may have to wear compression stockings or devices. ? You will be encouraged to walk around when you are able.  Hospital staff will encourage and support bonding with your baby. Your hospital may allow you and your baby to stay in the same room (rooming in) during your hospital stay to encourage successful breastfeeding.  You may be encouraged to cough and breathe deeply often. This helps to prevent lung problems.  If you have a catheter draining your urine, it will be removed as soon as possible after your procedure. This information is not intended to replace advice given to you by your health care provider. Make sure you discuss any  questions you have with your health care provider. Document Released: 11/05/2005 Document Revised: 04/12/2016 Document Reviewed: 08/16/2015 Elsevier Interactive Patient Education  2017 [employer]

## 2017-10-09 NOTE — Addendum Note (Signed)
Addended by: Jaynie CollinsANYANWU, Dellia Donnelly A on: 10/09/2017 03:27 PM   Modules accepted: Orders

## 2017-10-09 NOTE — Progress Notes (Addendum)
   PRENATAL VISIT NOTE  Subjective:  Alicia PurserLeslie Avila is a 31 y.o. G2P1001 at 3817w0d being seen today for ongoing prenatal care.  She is currently monitored for the following issues for this high-risk pregnancy and has Supervision of high risk pregnancy, antepartum, third trimester; Gestational diabetes mellitus; and Frank breech presentation on their problem list.  Patient reports no complaints.  Contractions: Irregular. Vag. Bleeding: None.  Movement: Present. Denies leaking of fluid.   The following portions of the patient's history were reviewed and updated as appropriate: allergies, current medications, past family history, past medical history, past social history, past surgical history and problem list. Problem list updated.  Objective:   Vitals:   10/09/17 1436  BP: 136/69  Pulse: 89  Weight: 210 lb 6.4 oz (95.4 kg)    Fetal Status: Fetal Heart Rate (bpm): 134 Fundal Height: 39 cm Movement: Present     General:  Alert, oriented and cooperative. Patient is in no acute distress.  Skin: Skin is warm and dry. No rash noted.   Cardiovascular: Normal heart rate noted  Respiratory: Normal respiratory effort, no problems with respiration noted  Abdomen: Soft, gravid, appropriate for gestational age.  Pain/Pressure: Present     Pelvic: Cervical exam deferred        Extremities: Normal range of motion.  Edema: None  Mental Status:  Normal mood and affect. Normal behavior. Normal judgment and thought content.   Assessment and Plan:  Pregnancy: G2P1001 at 8717w0d  1. Frank breech presentation, single or unspecified fetus Failed ECV on 10/03/2017. Scheduled for cesarean section on 10/11/2017 at 1:30 pm. Risks reviewed. Preoperative instructions given to patient. Orders placed.   2. Diet controlled gestational diabetes mellitus (GDM) in third trimester Stable blood sugars  3. Supervision of high risk pregnancy, antepartum, third trimester  Preterm labor symptoms and general obstetric  precautions including but not limited to vaginal bleeding, contractions, leaking of fluid and fetal movement were reviewed in detail with the patient. Please refer to After Visit Summary for other counseling recommendations.  Return in about 2 weeks (around 10/23/2017) for Incision check (RN visit)  6 weeks: 2hr GTT (postpartum) and Postpartum Visit.   Jaynie CollinsUgonna Hansika Leaming, MD

## 2017-10-10 MED ORDER — BUPIVACAINE IN DEXTROSE 0.75-8.25 % IT SOLN
INTRATHECAL | Status: AC
  Filled 2017-10-10: qty 4

## 2017-10-10 MED ORDER — CEFAZOLIN SODIUM-DEXTROSE 2-3 GM-%(50ML) IV SOLR
INTRAVENOUS | Status: AC
  Filled 2017-10-10: qty 50

## 2017-10-11 ENCOUNTER — Other Ambulatory Visit: Payer: Self-pay

## 2017-10-11 ENCOUNTER — Inpatient Hospital Stay (HOSPITAL_COMMUNITY): Payer: BLUE CROSS/BLUE SHIELD | Admitting: Anesthesiology

## 2017-10-11 ENCOUNTER — Inpatient Hospital Stay (HOSPITAL_COMMUNITY)
Admission: RE | Admit: 2017-10-11 | Discharge: 2017-10-14 | DRG: 788 | Disposition: A | Discharge status: Home / Self Care | Payer: BLUE CROSS/BLUE SHIELD | Source: Ambulatory Visit | Attending: Obstetrics and Gynecology | Admitting: Obstetrics and Gynecology

## 2017-10-11 ENCOUNTER — Encounter (HOSPITAL_COMMUNITY)
Admission: RE | Disposition: A | Discharge status: Home / Self Care | Payer: Self-pay | Source: Ambulatory Visit | Attending: Obstetrics and Gynecology

## 2017-10-11 ENCOUNTER — Encounter (HOSPITAL_COMMUNITY): Payer: Self-pay

## 2017-10-11 DIAGNOSIS — Z3A39 39 weeks gestation of pregnancy: Secondary | ICD-10-CM | POA: Diagnosis not present

## 2017-10-11 DIAGNOSIS — O99824 Streptococcus B carrier state complicating childbirth: Secondary | ICD-10-CM | POA: Diagnosis not present

## 2017-10-11 DIAGNOSIS — O321XX0 Maternal care for breech presentation, not applicable or unspecified: Secondary | ICD-10-CM | POA: Diagnosis not present

## 2017-10-11 DIAGNOSIS — O24420 Gestational diabetes mellitus in childbirth, diet controlled: Secondary | ICD-10-CM | POA: Diagnosis present

## 2017-10-11 DIAGNOSIS — Z98891 History of uterine scar from previous surgery: Secondary | ICD-10-CM

## 2017-10-11 LAB — TYPE AND SCREEN
ABO/RH(D): O POS
Antibody Screen: NEGATIVE

## 2017-10-11 LAB — CBC
HCT: 34.5 % — ABNORMAL LOW (ref 36.0–46.0)
Hemoglobin: 11.5 g/dL — ABNORMAL LOW (ref 12.0–15.0)
MCH: 29.9 pg (ref 26.0–34.0)
MCHC: 33.3 g/dL (ref 30.0–36.0)
MCV: 89.6 fL (ref 78.0–100.0)
PLATELETS: 208 10*3/uL (ref 150–400)
RBC: 3.85 MIL/uL — ABNORMAL LOW (ref 3.87–5.11)
RDW: 15.1 % (ref 11.5–15.5)
WBC: 8.3 10*3/uL (ref 4.0–10.5)

## 2017-10-11 LAB — GLUCOSE, RANDOM: GLUCOSE: 81 mg/dL (ref 65–99)

## 2017-10-11 SURGERY — Surgical Case
Anesthesia: Spinal

## 2017-10-11 MED ORDER — NALBUPHINE HCL 10 MG/ML IJ SOLN
5.0000 mg | INTRAMUSCULAR | Status: DC | PRN
Start: 2017-10-11 — End: 2017-10-14

## 2017-10-11 MED ORDER — ONDANSETRON HCL 4 MG/2ML IJ SOLN
INTRAMUSCULAR | Status: AC
  Filled 2017-10-11: qty 2

## 2017-10-11 MED ORDER — NALOXONE HCL 0.4 MG/ML IJ SOLN: 1.0000 ug/kg/h | INTRAMUSCULAR | Status: DC | PRN

## 2017-10-11 MED ORDER — LACTATED RINGERS IV SOLN
INTRAVENOUS | Status: DC
  Administered 2017-10-11 (×3): via INTRAVENOUS

## 2017-10-11 MED ORDER — TETANUS-DIPHTH-ACELL PERTUSSIS 5-2.5-18.5 LF-MCG/0.5 IM SUSP: 0.5000 mL | Freq: Once | INTRAMUSCULAR | Status: DC

## 2017-10-11 MED ORDER — SODIUM CHLORIDE 0.9% FLUSH: 3.0000 mL | INTRAVENOUS | Status: DC | PRN

## 2017-10-11 MED ORDER — SIMETHICONE 80 MG PO CHEW
80.0000 mg | CHEWABLE_TABLET | ORAL | Status: DC
  Administered 2017-10-11 – 2017-10-14 (×3): 80 mg via ORAL
  Filled 2017-10-11 (×3): qty 1

## 2017-10-11 MED ORDER — SENNOSIDES-DOCUSATE SODIUM 8.6-50 MG PO TABS
2.0000 | ORAL_TABLET | ORAL | Status: DC
  Administered 2017-10-11 – 2017-10-14 (×3): 2 via ORAL
  Filled 2017-10-11 (×4): qty 2

## 2017-10-11 MED ORDER — SIMETHICONE 80 MG PO CHEW
80.0000 mg | CHEWABLE_TABLET | Freq: Three times a day (TID) | ORAL | Status: DC
  Administered 2017-10-12 – 2017-10-14 (×6): 80 mg via ORAL
  Filled 2017-10-11 (×7): qty 1

## 2017-10-11 MED ORDER — SCOPOLAMINE 1 MG/3DAYS TD PT72: 1.0000 | MEDICATED_PATCH | Freq: Once | TRANSDERMAL | Status: DC

## 2017-10-11 MED ORDER — SCOPOLAMINE 1 MG/3DAYS TD PT72
MEDICATED_PATCH | TRANSDERMAL | Status: AC
  Administered 2017-10-11: 1.5 mg
  Filled 2017-10-11: qty 1

## 2017-10-11 MED ORDER — DIBUCAINE 1 % RE OINT: 1.0000 "application " | TOPICAL_OINTMENT | RECTAL | Status: DC | PRN

## 2017-10-11 MED ORDER — PROMETHAZINE HCL 25 MG/ML IJ SOLN: 6.2500 mg | INTRAMUSCULAR | Status: DC | PRN

## 2017-10-11 MED ORDER — PRENATAL MULTIVITAMIN CH
1.0000 | ORAL_TABLET | Freq: Every day | ORAL | Status: DC
  Administered 2017-10-12 – 2017-10-13 (×2): 1 via ORAL
  Filled 2017-10-11 (×2): qty 1

## 2017-10-11 MED ORDER — DIPHENHYDRAMINE HCL 25 MG PO CAPS
25.0000 mg | ORAL_CAPSULE | ORAL | Status: DC | PRN
  Administered 2017-10-12: 25 mg via ORAL
  Filled 2017-10-11: qty 1

## 2017-10-11 MED ORDER — CEFAZOLIN SODIUM-DEXTROSE 2-4 GM/100ML-% IV SOLN
2.0000 g | INTRAVENOUS | Status: AC
Start: 2017-10-11 — End: 2017-10-11
  Administered 2017-10-11: 2 g via INTRAVENOUS

## 2017-10-11 MED ORDER — FENTANYL CITRATE (PF) 100 MCG/2ML IJ SOLN
INTRAMUSCULAR | Status: AC
  Filled 2017-10-11: qty 2

## 2017-10-11 MED ORDER — DIPHENHYDRAMINE HCL 25 MG PO CAPS: 25.0000 mg | ORAL_CAPSULE | Freq: Four times a day (QID) | ORAL | Status: DC | PRN

## 2017-10-11 MED ORDER — LACTATED RINGERS IV SOLN
INTRAVENOUS | Status: DC | PRN
  Administered 2017-10-11: 14:00:00 via INTRAVENOUS

## 2017-10-11 MED ORDER — ONDANSETRON HCL 4 MG/2ML IJ SOLN: 4.0000 mg | Freq: Three times a day (TID) | INTRAMUSCULAR | Status: DC | PRN

## 2017-10-11 MED ORDER — PHENYLEPHRINE 8 MG IN D5W 100 ML (0.08MG/ML) PREMIX OPTIME
INJECTION | INTRAVENOUS | Status: AC
  Filled 2017-10-11: qty 100

## 2017-10-11 MED ORDER — NALBUPHINE HCL 10 MG/ML IJ SOLN: 5.0000 mg | Freq: Once | INTRAMUSCULAR | Status: DC | PRN

## 2017-10-11 MED ORDER — MEPERIDINE HCL 25 MG/ML IJ SOLN: 6.2500 mg | INTRAMUSCULAR | Status: DC | PRN

## 2017-10-11 MED ORDER — NALOXONE HCL 0.4 MG/ML IJ SOLN: 0.4000 mg | INTRAMUSCULAR | Status: DC | PRN

## 2017-10-11 MED ORDER — SOD CITRATE-CITRIC ACID 500-334 MG/5ML PO SOLN: 30.0000 mL | Freq: Once | ORAL | Status: DC

## 2017-10-11 MED ORDER — IBUPROFEN 600 MG PO TABS
600.0000 mg | ORAL_TABLET | Freq: Four times a day (QID) | ORAL | Status: DC
  Administered 2017-10-11 – 2017-10-14 (×10): 600 mg via ORAL
  Filled 2017-10-11 (×10): qty 1

## 2017-10-11 MED ORDER — COCONUT OIL OIL
1.0000 "application " | TOPICAL_OIL | Status: DC | PRN
  Administered 2017-10-13: 1 via TOPICAL
  Filled 2017-10-11: qty 120

## 2017-10-11 MED ORDER — OXYTOCIN 40 UNITS IN LACTATED RINGERS INFUSION - SIMPLE MED: 2.5000 [IU]/h | INTRAVENOUS | Status: AC

## 2017-10-11 MED ORDER — BUPIVACAINE IN DEXTROSE 0.75-8.25 % IT SOLN
INTRATHECAL | Status: AC
  Filled 2017-10-11: qty 2

## 2017-10-11 MED ORDER — LACTATED RINGERS IV SOLN
INTRAVENOUS | Status: DC
  Administered 2017-10-11 – 2017-10-12 (×2): via INTRAVENOUS

## 2017-10-11 MED ORDER — MENTHOL 3 MG MT LOZG: 1.0000 | LOZENGE | OROMUCOSAL | Status: DC | PRN

## 2017-10-11 MED ORDER — OXYTOCIN 10 UNIT/ML IJ SOLN
INTRAMUSCULAR | Status: AC
  Filled 2017-10-11: qty 4

## 2017-10-11 MED ORDER — ACETAMINOPHEN 325 MG PO TABS: 650.0000 mg | ORAL_TABLET | ORAL | Status: DC | PRN

## 2017-10-11 MED ORDER — OXYTOCIN 10 UNIT/ML IJ SOLN
INTRAMUSCULAR | Status: DC | PRN
  Administered 2017-10-11: 40 [IU] via INTRAVENOUS

## 2017-10-11 MED ORDER — KETOROLAC TROMETHAMINE 30 MG/ML IJ SOLN
INTRAMUSCULAR | Status: AC
  Administered 2017-10-11: 30 mg via INTRAVENOUS
  Filled 2017-10-11: qty 1

## 2017-10-11 MED ORDER — ONDANSETRON HCL 4 MG/2ML IJ SOLN
INTRAMUSCULAR | Status: DC | PRN
  Administered 2017-10-11: 4 mg via INTRAVENOUS

## 2017-10-11 MED ORDER — MORPHINE SULFATE (PF) 0.5 MG/ML IJ SOLN
INTRAMUSCULAR | Status: AC
  Filled 2017-10-11: qty 10

## 2017-10-11 MED ORDER — SOD CITRATE-CITRIC ACID 500-334 MG/5ML PO SOLN
30.0000 mL | ORAL | Status: AC
  Administered 2017-10-11: 30 mL via ORAL
  Filled 2017-10-11: qty 15

## 2017-10-11 MED ORDER — SIMETHICONE 80 MG PO CHEW: 80.0000 mg | CHEWABLE_TABLET | ORAL | Status: DC | PRN

## 2017-10-11 MED ORDER — WITCH HAZEL-GLYCERIN EX PADS: 1.0000 "application " | MEDICATED_PAD | CUTANEOUS | Status: DC | PRN

## 2017-10-11 MED ORDER — SODIUM CHLORIDE 0.9 % IR SOLN
Status: DC | PRN
  Administered 2017-10-11: 1

## 2017-10-11 MED ORDER — HYDROMORPHONE HCL 1 MG/ML IJ SOLN: 0.2500 mg | INTRAMUSCULAR | Status: DC | PRN

## 2017-10-11 MED ORDER — KETOROLAC TROMETHAMINE 30 MG/ML IJ SOLN
30.0000 mg | Freq: Once | INTRAMUSCULAR | Status: DC | PRN
  Administered 2017-10-11: 30 mg via INTRAVENOUS

## 2017-10-11 MED ORDER — PHENYLEPHRINE 8 MG IN D5W 100 ML (0.08MG/ML) PREMIX OPTIME
INJECTION | INTRAVENOUS | Status: DC | PRN
  Administered 2017-10-11: 60 ug/min via INTRAVENOUS

## 2017-10-11 MED ORDER — DIPHENHYDRAMINE HCL 50 MG/ML IJ SOLN
12.5000 mg | INTRAMUSCULAR | Status: DC | PRN
  Administered 2017-10-11 (×2): 12.5 mg via INTRAVENOUS
  Filled 2017-10-11 (×2): qty 1

## 2017-10-11 MED ORDER — NALBUPHINE HCL 10 MG/ML IJ SOLN
5.0000 mg | INTRAMUSCULAR | Status: DC | PRN
Start: 2017-10-11 — End: 2017-10-14
  Administered 2017-10-11: 5 mg via INTRAVENOUS
  Filled 2017-10-11: qty 1

## 2017-10-11 SURGICAL SUPPLY — 31 items
BENZOIN TINCTURE PRP APPL 2/3 (GAUZE/BANDAGES/DRESSINGS) ×3 IMPLANT
CHLORAPREP W/TINT 26ML (MISCELLANEOUS) ×3 IMPLANT
CLAMP CORD UMBIL (MISCELLANEOUS) IMPLANT
CLOSURE STERI STRIP 1/2 X4 (GAUZE/BANDAGES/DRESSINGS) ×3 IMPLANT
CONTAINER PREFILL 10% NBF 15ML (MISCELLANEOUS) IMPLANT
DRSG OPSITE POSTOP 4X10 (GAUZE/BANDAGES/DRESSINGS) ×3 IMPLANT
ELECT REM PT RETURN 9FT ADLT (ELECTROSURGICAL) ×3
ELECTRODE REM PT RTRN 9FT ADLT (ELECTROSURGICAL) ×1 IMPLANT
EXTRACTOR VACUUM M CUP 4 TUBE (SUCTIONS) IMPLANT
EXTRACTOR VACUUM M CUP 4' TUBE (SUCTIONS)
GLOVE BIOGEL PI IND STRL 6.5 (GLOVE) ×1 IMPLANT
GLOVE BIOGEL PI IND STRL 7.0 (GLOVE) ×1 IMPLANT
GLOVE BIOGEL PI INDICATOR 6.5 (GLOVE) ×2
GLOVE BIOGEL PI INDICATOR 7.0 (GLOVE) ×2
GLOVE SURG SS PI 6.0 STRL IVOR (GLOVE) ×3 IMPLANT
GOWN STRL REUS W/TWL LRG LVL3 (GOWN DISPOSABLE) ×6 IMPLANT
KIT ABG SYR 3ML LUER SLIP (SYRINGE) IMPLANT
NEEDLE HYPO 25X5/8 SAFETYGLIDE (NEEDLE) IMPLANT
NS IRRIG 1000ML POUR BTL (IV SOLUTION) ×3 IMPLANT
PACK C SECTION WH (CUSTOM PROCEDURE TRAY) ×3 IMPLANT
PAD OB MATERNITY 4.3X12.25 (PERSONAL CARE ITEMS) ×3 IMPLANT
PENCIL SMOKE EVAC W/HOLSTER (ELECTROSURGICAL) ×3 IMPLANT
RTRCTR C-SECT PINK 25CM LRG (MISCELLANEOUS) IMPLANT
SEPRAFILM MEMBRANE 5X6 (MISCELLANEOUS) IMPLANT
SPONGE LAP 18X18 X RAY DECT (DISPOSABLE) ×3 IMPLANT
SUT PLAIN 0 NONE (SUTURE) IMPLANT
SUT VIC AB 0 CT1 36 (SUTURE) ×15 IMPLANT
SUT VIC AB 4-0 KS 27 (SUTURE) ×3 IMPLANT
SUT VIC AB 4-0 PS2 27 (SUTURE) ×3 IMPLANT
TOWEL OR 17X24 6PK STRL BLUE (TOWEL DISPOSABLE) ×3 IMPLANT
TRAY FOLEY BAG SILVER LF 14FR (SET/KITS/TRAYS/PACK) ×3 IMPLANT

## 2017-10-11 NOTE — Anesthesia Postprocedure Evaluation (Signed)
Anesthesia Post Note  Patient: Alicia Avila  Procedure(s) Performed: CESAREAN SECTION (N/A )     Patient location during evaluation: Mother Baby Anesthesia Type: Spinal Level of consciousness: awake and alert Pain management: pain level controlled Vital Signs Assessment: post-procedure vital signs reviewed and stable Respiratory status: spontaneous breathing, nonlabored ventilation and respiratory function stable Cardiovascular status: stable Postop Assessment: no headache, no backache and epidural receding Anesthetic complications: no    Last Vitals:  Vitals:   10/11/17 1758 10/11/17 1851  BP: 113/63 107/73  Pulse: (!) 55 73  Resp: 20 20  Temp: 36.6 C   SpO2: 96% 99%    Last Pain:  Vitals:   10/11/17 1851  TempSrc: Oral  PainSc: 0-No pain   Pain Goal: Patients Stated Pain Goal: 5 (10/11/17 1149)               [name]

## 2017-10-11 NOTE — Op Note (Signed)
Alicia Avila PROCEDURE DATE: 10/11/2017  PREOPERATIVE DIAGNOSIS: Intrauterine pregnancy at  7126w2d weeks gestation; malpresentation: breech  POSTOPERATIVE DIAGNOSIS: The same  PROCEDURE:     Cesarean Section  SURGEON:  Dr. Catalina AntiguaPeggy Leotha Westermeyer  ASSISTANT: Rolla Flattene. Moss  INDICATIONS: Alicia PurserLeslie Avila is a 31 y.o. G2P1001 at 5526w2d scheduled for cesarean section secondary to malpresentation: breech.  The risks of cesarean section discussed with the patient included but were not limited to: bleeding which may require transfusion or reoperation; infection which may require antibiotics; injury to bowel, bladder, ureters or other surrounding organs; injury to the fetus; need for additional procedures including hysterectomy in the event of a life-threatening hemorrhage; placental abnormalities wth subsequent pregnancies, incisional problems, thromboembolic phenomenon and other postoperative/anesthesia complications. The patient concurred with the proposed plan, giving informed written consent for the procedure.    FINDINGS:  Viable female infant in breech presentation.  Apgars 9 and 9.  Clear amniotic fluid.  Intact placenta, three vessel cord.  Normal uterus, fallopian tubes and ovaries bilaterally.  ANESTHESIA:    Spinal INTRAVENOUS FLUIDS:2400 ml ESTIMATED BLOOD LOSS: 804 mL ml URINE OUTPUT:  250 ml SPECIMENS: Placenta sent to L&D COMPLICATIONS: None immediate  PROCEDURE IN DETAIL:  The patient received intravenous antibiotics and had sequential compression devices applied to her lower extremities while in the preoperative area.  She was then taken to the operating room where anesthesia was induced and was found to be adequate. A foley catheter was placed into her bladder and attached to [place] Sandiford gravity. She was then placed in a dorsal supine position with a leftward tilt, and prepped and draped in a sterile manner. After an adequate timeout was performed, a Pfannenstiel skin incision was made with scalpel and  carried through to the underlying layer of fascia. The fascia was incised in the midline and this incision was extended bilaterally using the Mayo scissors. Kocher clamps were applied to the superior aspect of the fascial incision and the underlying rectus muscles were dissected off bluntly. A similar process was carried out on the inferior aspect of the facial incision. The rectus muscles were separated in the midline bluntly and the peritoneum was entered bluntly. The Alexis self-retaining retractor was introduced into the abdominal cavity. Attention was turned to the lower uterine segment where a transverse hysterotomy was made with a scalpel and extended bilaterally bluntly. The infant was successfully delivered. Delayed cord clamping was performed for 1 minute and cord was clamped and cut. The infant was handed over to awaiting neonatology team. Uterine massage was then administered and the placenta delivered intact with three-vessel cord. The uterus was cleared of clot and debris.  The hysterotomy was closed with 0 Vicryl in a running locked fashion, and an imbricating layer was also placed with a 0 Vicryl. Overall, excellent hemostasis was noted. The pelvis copiously irrigated and cleared of all clot and debris. Hemostasis was confirmed on all surfaces.  The peritoneum and the muscles were reapproximated using 0 vicryl interrupted stitches. The fascia was then closed using 0 Vicryl in a running fashion.  The skin was closed in a subcuticular fashion using 3.0 Vicryl. The patient tolerated the procedure well. Sponge, lap, instrument and needle counts were correct x 2. She was taken to the recovery room in stable condition.    Bomani Oommen ConstantMD  10/11/2017 2:55 PM

## 2017-10-11 NOTE — Transfer of Care (Signed)
Immediate Anesthesia Transfer of Care Note  Patient: Alicia Avila  Procedure(s) Performed: CESAREAN SECTION (N/A )  Patient Location: PACU  Anesthesia Type:Spinal  Level of Consciousness: awake, alert  and oriented  Airway & Oxygen Therapy: Patient Spontanous Breathing  Post-op Assessment: Report given to RN and Post -op Vital signs reviewed and stable  Post vital signs: Reviewed and stable  Last Vitals:  Vitals:   10/11/17 1121  BP: 127/73  Pulse: (!) 106  Resp: 16  Temp: 36.9 C  SpO2: 99%    Last Pain:  Vitals:   10/11/17 1149  TempSrc:   PainSc: 0-No pain      Patients Stated Pain Goal: 5 (10/11/17 1149)  Complications: No apparent anesthesia complications

## 2017-10-11 NOTE — Anesthesia Preprocedure Evaluation (Signed)
Anesthesia Evaluation  Patient identified by MRN, date of birth, ID band Patient awake    Reviewed: Allergy & Precautions, Patient's Chart, lab work & pertinent test results  History of Anesthesia Complications Negative for: history of anesthetic complications  Airway Mallampati: III  TM Distance: >3 FB Neck ROM: Full    Dental  (+) Teeth Intact   Pulmonary neg pulmonary ROS,    breath sounds clear to auscultation       Cardiovascular negative cardio ROS   Rhythm:Regular     Neuro/Psych negative neurological ROS  negative psych ROS   GI/Hepatic negative GI ROS, Neg liver ROS,   Endo/Other  diabetes  Renal/GU negative Renal ROS     Musculoskeletal   Abdominal   Peds  Hematology negative hematology ROS (+)   Anesthesia Other Findings   Reproductive/Obstetrics (+) Pregnancy                             Anesthesia Physical  Anesthesia Plan  ASA: II  Anesthesia Plan: Spinal   Post-op Pain Management:    Induction:   PONV Risk Score and Plan:   Airway Management Planned:   Additional Equipment:   Intra-op Plan:   Post-operative Plan:   Informed Consent: I have reviewed the patients History and Physical, chart, labs and discussed the procedure including the risks, benefits and alternatives for the proposed anesthesia with the patient or authorized representative who has indicated his/her understanding and acceptance.   Dental advisory given  Plan Discussed with: Anesthesiologist  Anesthesia Plan Comments:         Anesthesia Quick Evaluation

## 2017-10-11 NOTE — H&P (Signed)
Obstetric Preoperative History and Physical  Alicia Avila is a 31 y.o. G2P1001 with IUP at 37w2dpresenting for scheduled primary cesarean section for breech presentation.  No acute concerns.   [facility] Patient Active Problem List   Diagnosis Date Noted  . Frank breech presentation 10/02/2017  . Supervision of high risk pregnancy, antepartum, third trimester 08/27/2017  . Gestational diabetes mellitus 08/27/2017   She plans to breastfeed She desires patch for postpartum contraception.   [facility]  Dating  LMP Blood type: O/Positive/-- (06/18 0000)   Genetic Screen 1 Screen:    AFP:     Quad:     NIPS: Antibody:Negative (06/18 0000)  Anatomic UK[place]Normal Rubella: _0 @  GTT  Third trimester: 153 RPR: Nonreactive (09/13 0000)   Flu vaccine  declines HBsAg: Negative (06/18 0000)   TDaP vaccine    08/01/17                        HIV: Non-reactive (09/13 0000)   Baby Food     breast                                          GBS: positive  Contraception      patch Pap:  Circumcision       yes   Pediatrician  TAPM   Support Person Mom   Prenatal Classes No Hgb electrophoresis:    Prenatal Transfer Tool  Maternal Diabetes: Yes:  Diabetes Type:  Diet controlled Maternal Ultrasounds/Referrals: Normal Fetal Ultrasounds or other Referrals:  None Maternal Substance Abuse:  No Significant Maternal Medications:  None Significant Maternal Lab Results: Lab values include: Group B Strep positive  Past Medical History:  Diagnosis Date  . Medical history non-contributory     Past Surgical History:  Procedure Laterality Date  . NO PAST SURGERIES      OB History  Gravida Para Term Preterm AB Living  _1 SAB TAB Ectopic Multiple Live Births        0 1    # Outcome Date GA Lbr Len/2nd Weight Sex Delivery Anes PTL Lv  2 Current           1 Term 08/18/15 421w1d6:26 / 00:34 8 lb 12.9 oz  (3.994 kg) F Vag-Vacuum EPI  LIV     Birth Comments: none      Social History   Socioeconomic History  . Marital status: Married    Spouse name: None  . Number of children: None  . Years of education: None  . Highest education level: None  Social Needs  . Financial resource strain: None  . Food insecurity - worry: None  . Food insecurity - inability: None  . Transportation needs - medical: None  . Transportation needs - non-medical: None  Occupational History  . None  Tobacco Use  . Smoking status: Never Smoker  . Smokeless tobacco: Never Used  Substance and Sexual Activity  . Alcohol use: No  . Drug use: No  . Sexual activity: Yes    Birth control/protection: None  Other Topics Concern  . None  Social History Narrative  . None    No family history on file.  Medications Prior to Admission  Medication Sig Dispense Refill Last Dose  .  ACCU-CHEK FASTCLIX LANCETS MISC 1 Device by Does not apply route 4 (four) times daily. 360 each 3 10/10/2017 at Unknown time  . Blood Glucose Monitoring Suppl (ACCU-CHEK GUIDE) w/Device KIT 1 Device by Does not apply route 4 (four) times daily. 1 kit 0 10/10/2017 at Unknown time  . glucose blood (ACCU-CHEK GUIDE) test strip Use as instructed 100 each 12 10/10/2017 at Unknown time  . prenatal vitamin w/FE, FA (PRENATAL 1 + 1) 27-1 MG TABS tablet Take 1 tablet by mouth daily.    10/10/2017 at Unknown time  . Probiotic CAPS Take 1 capsule by mouth daily.   10/10/2017 at Unknown time    No Known Allergies  Review of Systems: Negative except for what is mentioned in HPI.  Physical Exam: BP 127/73 (BP Location: Right Arm)   Pulse (!) 106   Temp 98.5 F (36.9 C) (Oral)   Resp 16   Ht _0  (1.626 m)   Wt 210 lb (95.3 kg)   LMP 01/09/2017 (Approximate)   SpO2 99%   BMI 36.05 kg/m   CONSTITUTIONAL: Well-developed, well-nourished female in no acute distress.  HENT:  Normocephalic, atraumatic. Oropharynx is clear and moist EYES:  Conjunctivae and EOM are normal. No scleral icterus.  NECK: Normal range of motion, supple SKIN: Skin is warm and dry. No rash noted. Not diaphoretic. No erythema. Marsing: Alert and oriented to person, place, and time. Normal reflexes, muscle tone coordination. No cranial nerve deficit noted. PSYCHIATRIC: Normal mood and affect. Normal behavior. CARDIOVASCULAR: Normal heart rate noted, regular rhythm RESPIRATORY: Effort and breath sounds normal, no problems with respiration noted ABDOMEN: Soft, nontender, nondistended, gravid.  PELVIC: Deferred MUSCULOSKELETAL: Normal range of motion. No edema and no tenderness. 2+ distal pulses.   Pertinent Labs/Studies:   Results for orders placed or performed during the hospital encounter of 10/11/17 (from the past 72 hour(s))  CBC     Status: Abnormal   Collection Time: 10/11/17 11:37 AM  Result Value Ref Range   WBC 8.3 4.0 - 10.5 K/uL   RBC 3.85 (L) 3.87 - 5.11 MIL/uL   Hemoglobin 11.5 (L) 12.0 - 15.0 g/dL   HCT 34.5 (L) 36.0 - 46.0 %   MCV 89.6 78.0 - 100.0 fL   MCH 29.9 26.0 - 34.0 pg   MCHC 33.3 30.0 - 36.0 g/dL   RDW 15.1 11.5 - 15.5 %   Platelets 208 150 - 400 K/uL  Type and screen     Status: None   Collection Time: 10/11/17 11:37 AM  Result Value Ref Range   ABO/RH(D) O POS    Antibody Screen NEG    Sample Expiration 10/14/2017   Glucose, random     Status: None   Collection Time: 10/11/17 11:55 AM  Result Value Ref Range   Glucose, Bld 81 65 - 99 mg/dL    Assessment and Plan :Alicia Avila is a 31 y.o. G2P1001 at 1w2dbeing admitted for primary scheduled cesarean section for breech presentation. The risks of cesarean section discussed with the patient included but were not limited to: bleeding which may require transfusion or reoperation; infection which may require antibiotics; injury to bowel, bladder, ureters or other surrounding organs; injury to the fetus; need for additional procedures including hysterectomy in the event  of a life-threatening hemorrhage; placental abnormalities wth subsequent pregnancies, incisional problems, thromboembolic phenomenon and other postoperative/anesthesia complications. The patient concurred with the proposed plan, giving informed written consent for the procedure. Patient has been NPO since last night she  will remain NPO for procedure. Anesthesia and OR aware. Preoperative prophylactic antibiotics and SCDs ordered on call to the OR. To OR when ready.  MOF- nreast MOC-patch Desires inpatient circ  Dannielle Huh, DO OB Fellow Faculty Practice, [facility]

## 2017-10-11 NOTE — Lactation Note (Signed)
This note was copied from a baby's chart. Lactation Consultation Note  Patient Name: Alicia Avila ZOXWR'UToday's Date: 10/11/2017 Reason for consult: Initial assessment;Term Breastfeeding consultation services and support information given and reviewed.  This is mom's second baby and newborn is 4 hours old.  Baby has been to breast twice.  Mom BF her first baby for 3 months.  Instructed to watch baby for feeding cues and to call for assist prn.  Mom would like a manual pump.  Pump given with instructions from RN.  Maternal Data Does the patient have breastfeeding experience prior to this delivery?: Yes  Feeding Feeding Type: Breast Fed Length of feed: 45 min  LATCH Score                   Interventions    Lactation Tools Discussed/Used     Consult Status Consult Status: Follow-up Date: 10/12/17 Follow-up type: In-patient    Huston FoleyMOULDEN, Gordan Grell S 10/11/2017, 6:21 PM

## 2017-10-12 ENCOUNTER — Encounter (HOSPITAL_COMMUNITY): Payer: Self-pay

## 2017-10-12 LAB — GLUCOSE, CAPILLARY: GLUCOSE-CAPILLARY: 100 mg/dL — AB (ref 65–99)

## 2017-10-12 LAB — CBC
HEMATOCRIT: 25.5 % — AB (ref 36.0–46.0)
HEMOGLOBIN: 8.7 g/dL — AB (ref 12.0–15.0)
MCH: 30.6 pg (ref 26.0–34.0)
MCHC: 33.7 g/dL (ref 30.0–36.0)
MCV: 90.7 fL (ref 78.0–100.0)
Platelets: 162 10*3/uL (ref 150–400)
RBC: 2.81 MIL/uL — AB (ref 3.87–5.11)
RDW: 15.1 % (ref 11.5–15.5)
WBC: 9.3 10*3/uL (ref 4.0–10.5)

## 2017-10-12 LAB — RPR: RPR Ser Ql: NONREACTIVE

## 2017-10-12 MED ORDER — FERROUS SULFATE 325 (65 FE) MG PO TABS
325.0000 mg | ORAL_TABLET | Freq: Two times a day (BID) | ORAL | Status: DC
  Administered 2017-10-12 – 2017-10-14 (×5): 325 mg via ORAL
  Filled 2017-10-12 (×5): qty 1

## 2017-10-12 MED ORDER — LACTATED RINGERS IV BOLUS (SEPSIS)
500.0000 mL | Freq: Once | INTRAVENOUS | Status: AC
  Administered 2017-10-12: 500 mL via INTRAVENOUS

## 2017-10-12 NOTE — Lactation Note (Signed)
This note was copied from a baby's chart. Lactation Consultation Note  Patient Name: Boy Terie PurserLeslie Broxton ZOXWR'UToday's Date: 10/12/2017 Reason for consult: Follow-up assessment   Follow up with mom of 29 hour old infant. Infant with 7 BF for 30-60 minutes, 4 voids, 5 stools, and 1 emesis in the last 24 hours, LATCH scores 7. Infant weight 8 lb 13.8 oz with weight loss of 3% since birth.   Mom denies andy questions/concerns and feels BF is going well. Mom was up walking in the hallway. She reports she does not need any LC assistance at this time.    Maternal Data Formula Feeding for Exclusion: No  Feeding Feeding Type: Breast Fed Length of feed: 45 min  LATCH Score Latch: Repeated attempts needed to sustain latch, nipple held in mouth throughout feeding, stimulation needed to elicit sucking reflex.  Audible Swallowing: A few with stimulation  Type of Nipple: Everted at rest and after stimulation  Comfort (Breast/Nipple): Soft / non-tender  Hold (Positioning): Assistance needed to correctly position infant at breast and maintain latch.  LATCH Score: 7  Interventions    Lactation Tools Discussed/Used     Consult Status Consult Status: Follow-up Date: 10/13/17 Follow-up type: In-patient    Silas FloodSharon S Shaunee Mulkern 10/12/2017, 8:12 PM

## 2017-10-12 NOTE — Progress Notes (Signed)
POSTPARTUM PROGRESS NOTE  Post Partum Day 1 Subjective:  Alicia Avila is a 31 y.o. G2P1001 3452w3d s/p pLTCS for breech.  No acute events overnight.  Pt denies problems with ambulating, voiding or po intake.  She denies nausea or vomiting.  Pain is well controlled. Lochia Minimal.   Objective: Blood pressure (!) 110/49, pulse 76, temperature 98 F (36.7 C), temperature source Axillary, resp. rate 18, height 5\' 4"  (1.626 m), weight 210 lb (95.3 kg), last menstrual period 01/09/2017, SpO2 98 %, unknown if currently breastfeeding.  Physical Exam:  General: alert, cooperative and no distress Lochia:normal flow Chest: no respiratory distress Heart:regular rate, distal pulses intact Abdomen: soft, nontender,  Uterine Fundus: firm, appropriately tender DVT Evaluation: No calf swelling or tenderness Extremities: no edema  Recent Labs    10/11/17 1137  HGB 11.5*  HCT 34.5*    Assessment/Plan:  ASSESSMENT: Alicia PurserLeslie Sandra is a 31 y.o. G2P1001 6452w3d s/p pLTCS.  Plan for discharge tomorrow   LOS: 1 day   Makhia Vosler MossDO 10/12/2017, 4:13 AM

## 2017-10-13 MED ORDER — OXYCODONE HCL 5 MG PO TABS: 5.0000 mg | ORAL_TABLET | Freq: Three times a day (TID) | ORAL | 0 refills | Status: DC | PRN

## 2017-10-13 MED ORDER — IBUPROFEN 600 MG PO TABS: 600.0000 mg | ORAL_TABLET | Freq: Four times a day (QID) | ORAL | 1 refills | Status: DC

## 2017-10-13 NOTE — Discharge Summary (Signed)
OB Discharge Summary     Patient Name: Alicia Avila DOB: Dec 07, 1985 MRN: [id number]  Date of admission: 10/11/2017 Delivering MD: Mora Bellman   Date of discharge: 10/13/2017  Admitting diagnosis: Malpresentation at term, A1GDM. Scheduled C/S.   Intrauterine pregnancy: [redacted]w[redacted]d    Secondary diagnosis:  Active Problems:   S/P cesarean section  Additional problems: None     Discharge diagnosis: Term Pregnancy Delivered and GDM A1                                                                                                Post partum procedures:None  Augmentation: None  Complications: None  Hospital course:  Sceduled C/S   [name] at 311w2das admitted to the hospital 10/11/2017 for scheduled cesarean section with the following indication:Malpresentation.  Membrane Rupture Time/Date: 2:20 PM ,10/11/2017   Patient delivered a Viable infant.10/11/2017  Details of operation can be found in separate operative note.  [name] had an uncomplicated postpartum course.  She is ambulating, tolerating a regular diet, passing flatus, and urinating well. Patient is discharged home in stable condition on  10/13/17         Physical exam  Vitals:   10/12/17 1150 10/12/17 1450 10/12/17 1855 10/13/17 0617  BP: 109/61 (!) 108/49 123/60 (!) 117/55  Pulse: 77 67 75 80  Resp: _0 Temp: 97.7 F (36.5 C) 98.3 F (36.8 C) 98.5 F (36.9 C) 98.7 F (37.1 C)  TempSrc: Oral Axillary Oral Oral  SpO2: 95% 99%  96%  Weight:      Height:       General: alert Lochia: appropriate Uterine Fundus: firm Incision: Healing well with no significant drainage DVT Evaluation: No evidence of DVT seen on physical exam. Labs: Lab Results  Component Value Date   WBC 9.3 10/12/2017   HGB 8.7 (L) 10/12/2017   HCT 25.5 (L) 10/12/2017   MCV 90.7 10/12/2017   PLT 162 10/12/2017   CMP Latest Ref Rng & Units 10/11/2017  Glucose 65 - 99 mg/dL 81    Discharge instruction: per After  Visit Summary and "Baby and Me Booklet".  After visit meds:  Allergies as of 10/13/2017   No Known Allergies     Medication List    STOP taking these medications   ACCU-CHEK FASTCLIX LANCETS Misc   ACCU-CHEK GUIDE w/Device Kit   glucose blood test strip Commonly known as:  ACCU-CHEK GUIDE     TAKE these medications   ibuprofen 600 MG tablet Commonly known as:  ADVIL,MOTRIN Take 1 tablet (600 mg total) by mouth every 6 (six) hours.   oxyCODONE 5 MG immediate release tablet Commonly known as:  ROXICODONE Take 1 tablet (5 mg total) by mouth every 8 (eight) hours as needed.   prenatal vitamin w/FE, FA 27-1 MG Tabs tablet Take 1 tablet by mouth daily.   Probiotic Caps Take 1 capsule by mouth daily.       Diet: routine diet  Activity: Advance as tolerated. Pelvic rest for 6 weeks.   Outpatient follow up:2 weeks Follow up Appt:  Future Appointments  Date Time Provider [place]  10/16/2017  2:00 PM Anyanwu, Sallyanne Havers, MD [facility]  10/23/2017 11:15 AM [facility] NURSE [facility]OC  11/14/2017 10:40 AM Ardean Larsen, Mervyn Skeeters, CNM [facility] WOC   Follow up Visit:No Follow-up on file.  Postpartum contraception: patch. Recommended non-estrogen BC for first 6 months of Breastfeeding.   Newborn Data: Live born female  Birth Weight: 9 lb 2.7 oz (4160 g) APGAR: 9, 9  Newborn Delivery   Birth date/time:  10/11/2017 14:20:00 Delivery type:  C-Section, Low Transverse C-section categorization:  Primary     Baby Feeding: Breast Disposition:home with mother   10/13/2017 Manya Silvas, CNM

## 2017-10-13 NOTE — Discharge Instructions (Signed)

## 2017-10-13 NOTE — Progress Notes (Signed)
Patient ID: Alicia Avila, female   DOB: 1986-02-17, 31 y.o.   MRN: [id number] Baby not being discharge. Pt's D/C cancelled.   Katrinka BlazingSmith, [place], CNM 10/13/2017 2:30 PM

## 2017-10-14 LAB — GLUCOSE, CAPILLARY: Glucose-Capillary: 84 mg/dL (ref 65–99)

## 2017-10-14 NOTE — Discharge Summary (Signed)
OB Discharge Summary *patient held extra day after planned d/c, there are now two d/c notes in this encounter*     Patient Name: Alicia Avila DOB: [birth year] MRN: [id number]  Date of admission: 10/11/2017 Delivering MD: Mora Bellman   Date of discharge: 10/14/2017  Admitting diagnosis: Malpresentation at term, A1GDM. Scheduled C/S.   Intrauterine pregnancy: [redacted]w[redacted]d    Secondary diagnosis:  Active Problems:   S/P cesarean section  Additional problems: None     Discharge diagnosis: Term Pregnancy Delivered and GDM A1                                                                                                Post partum procedures:None  Augmentation: None  Complications: None  Hospital course:  Sceduled C/S   [name] at 338w2das admitted to the hospital 10/11/2017 for scheduled cesarean section with the following indication:Malpresentation.  Membrane Rupture Time/Date: 2:20 PM ,10/11/2017   Patient delivered a Viable infant.10/11/2017  Details of operation can be found in separate operative note.  [name] had an uncomplicated postpartum course.  She is ambulating, tolerating a regular diet, passing flatus, and urinating well. Patient is discharged home in stable condition on  10/14/17         Physical exam  Vitals:   10/12/17 1855 10/13/17 0617 10/13/17 1740 10/14/17 0550  BP: 123/60 (!) 117/55 (!) 112/59 122/66  Pulse: 75 80 86 88  Resp: _0 Temp: 98.5 F (36.9 C) 98.7 F (37.1 C) 99.1 F (37.3 C) 98.4 F (36.9 C)  TempSrc: Oral Oral Oral Oral  SpO2:  96% 99%   Weight:      Height:       General: alert Lochia: appropriate Uterine Fundus: firm Incision: Healing well with no significant drainage DVT Evaluation: No evidence of DVT seen on physical exam. Labs: Lab Results  Component Value Date   WBC 9.3 10/12/2017   HGB 8.7 (L) 10/12/2017   HCT 25.5 (L) 10/12/2017   MCV 90.7 10/12/2017   PLT 162 10/12/2017   CMP Latest Ref Rng &  Units 10/11/2017  Glucose 65 - 99 mg/dL 81    Discharge instruction: per After Visit Summary and "Baby and Me Booklet".  After visit meds:  Allergies as of 10/14/2017   No Known Allergies     Medication List    STOP taking these medications   ACCU-CHEK FASTCLIX LANCETS Misc   ACCU-CHEK GUIDE w/Device Kit   glucose blood test strip Commonly known as:  ACCU-CHEK GUIDE     TAKE these medications   ibuprofen 600 MG tablet Commonly known as:  ADVIL,MOTRIN Take 1 tablet (600 mg total) by mouth every 6 (six) hours.   oxyCODONE 5 MG immediate release tablet Commonly known as:  ROXICODONE Take 1 tablet (5 mg total) by mouth every 8 (eight) hours as needed.   prenatal vitamin w/FE, FA 27-1 MG Tabs tablet Take 1 tablet by mouth daily.   Probiotic Caps Take 1 capsule by mouth daily.       Diet: routine diet  Activity: Advance  as tolerated. Pelvic rest for 6 weeks.   Outpatient follow up:2 weeks Follow up Appt: Future Appointments  Date Time Provider [place]  10/16/2017  2:00 PM Anyanwu, Sallyanne Havers, MD [facility] WOC  10/23/2017 11:15 AM WOC-WOCA NURSE WOC-[facility]A WOC  11/14/2017 10:40 AM Ardean Larsen, Mervyn Skeeters, CNM [facility] WOC   Follow up Visit:No Follow-up on file.  Postpartum contraception: patch. Recommended non-estrogen BC for first 6 months of Breastfeeding.   Newborn Data: Live born female  Birth Weight: 9 lb 2.7 oz (4160 g) APGAR: 9, 9  Newborn Delivery   Birth date/time:  10/11/2017 14:20:00 Delivery type:  C-Section, Low Transverse C-section categorization:  Primary     Baby Feeding: Breast Disposition:home with mother   10/14/2017 Sherene Sires, DO  OB [place]  I have seen and examined this patient. I agree with above documentation and have made edits as needed.   Luiz Blare, DO OB Fellow

## 2017-10-16 ENCOUNTER — Encounter: Payer: Medicaid Other | Admitting: Obstetrics & Gynecology

## 2017-10-23 ENCOUNTER — Ambulatory Visit: Payer: BLUE CROSS/BLUE SHIELD

## 2017-11-14 ENCOUNTER — Ambulatory Visit: Payer: BLUE CROSS/BLUE SHIELD | Admitting: Student

## 2018-11-19 NOTE — L&D Delivery Note (Signed)
Delivery Note:  [id number] at [redacted]w[redacted]d  Admitting diagnosis: 39WKS CTX Risks: None Onset of labor: 09/22/2019 @ 0200 Augmentation: Pitocin ROM: 09/22/2019 @ 1148  Complete dilation at 09/22/2019  @ 1235 Onset of pushing at 1240 FHR second stage Cat 1 - occasional mild variables with ctx while pushing  Analgesia /Anesthesia intrapartum:Epidural  Pushing in lithotomy position CNM, SNM, RN, FOB present for birth and supportive  Delivery of a Live born female  Birth Weight:  pending APGAR: 8, 9  Newborn Delivery   Birth date/time: 09/22/2019 12:54:00 Delivery type: VBAC, Spontaneous      in vertex presentation, position LOA to LOT.  Nuchal Cord: No  Cord double clamped after cessation of pulsation, cut by FOB under my direct supervision.  Collection of cord blood for typing completed. Cord blood donation-None  Arterial cord blood sample-No    Placenta delivered-Spontaneous  with 3 vessels . Uterotonics: IV Pitocin following delivery of placenta Placenta to L&D to be discarded. Uterine tone firm, bleeding light.  1st degree;Vaginal  laceration identified.  Episiotomy:None  Local analgesia: Epidural in place  Repair: 1st degree perineal laceration - Repaired with 2-0 Vicryl in standard fashion Est. Blood Loss (VW):979.48   Complications: None  APGAR:1 min-8 , 5 min-9  10 min-   Mom to postpartum.  Baby to Couplet care / Skin to Skin.  Delivery Report:  Review the Delivery Report for details.     Signed: Juanna Cao, SNM, BSN 09/22/2019, 1:45 PM

## 2019-07-07 ENCOUNTER — Telehealth: Payer: Self-pay | Admitting: Family Medicine

## 2019-07-07 NOTE — Telephone Encounter (Signed)
Called and left patient a detailed message about her appointment change and was told to call our office if she had any questions.

## 2019-07-15 ENCOUNTER — Encounter: Payer: BLUE CROSS/BLUE SHIELD | Admitting: Family Medicine

## 2019-07-22 ENCOUNTER — Encounter: Payer: BLUE CROSS/BLUE SHIELD | Admitting: Obstetrics and Gynecology

## 2019-07-23 ENCOUNTER — Encounter: Payer: BLUE CROSS/BLUE SHIELD | Admitting: Obstetrics and Gynecology

## 2019-08-10 ENCOUNTER — Encounter: Payer: Self-pay | Admitting: Family Medicine

## 2019-08-10 ENCOUNTER — Ambulatory Visit (INDEPENDENT_AMBULATORY_CARE_PROVIDER_SITE_OTHER): Payer: BLUE CROSS/BLUE SHIELD | Admitting: Family Medicine

## 2019-08-10 ENCOUNTER — Other Ambulatory Visit: Payer: Self-pay | Admitting: Family Medicine

## 2019-08-10 ENCOUNTER — Other Ambulatory Visit: Payer: Self-pay

## 2019-08-10 VITALS — BP 135/73 | HR 94 | Temp 98.3°F | Wt 198.5 lb

## 2019-08-10 DIAGNOSIS — Z113 Encounter for screening for infections with a predominantly sexual mode of transmission: Secondary | ICD-10-CM

## 2019-08-10 DIAGNOSIS — O0993 Supervision of high risk pregnancy, unspecified, third trimester: Secondary | ICD-10-CM

## 2019-08-10 DIAGNOSIS — Z23 Encounter for immunization: Secondary | ICD-10-CM | POA: Diagnosis not present

## 2019-08-10 DIAGNOSIS — Z8632 Personal history of gestational diabetes: Secondary | ICD-10-CM

## 2019-08-10 DIAGNOSIS — Z3A33 33 weeks gestation of pregnancy: Secondary | ICD-10-CM

## 2019-08-10 DIAGNOSIS — O0930 Supervision of pregnancy with insufficient antenatal care, unspecified trimester: Secondary | ICD-10-CM | POA: Insufficient documentation

## 2019-08-10 DIAGNOSIS — O0933 Supervision of pregnancy with insufficient antenatal care, third trimester: Secondary | ICD-10-CM

## 2019-08-10 DIAGNOSIS — O0990 Supervision of high risk pregnancy, unspecified, unspecified trimester: Secondary | ICD-10-CM | POA: Insufficient documentation

## 2019-08-10 DIAGNOSIS — Z98891 History of uterine scar from previous surgery: Secondary | ICD-10-CM

## 2019-08-10 NOTE — Progress Notes (Signed)
Subjective:  Alicia Avila is a [id number] [redacted]w[redacted]d by LmP being seen today for her first obstetrical visit.  Her obstetrical history is significant for late to prenatal care. Had history of c/s with last pregnancy due to malposition. Would like to VBAC. Patient does intend to breast feed. Pregnancy history fully reviewed.  Patient reports no complaints.  BP 135/73   Pulse 94   Temp 98.3 F (36.8 C)   Wt 198 lb 8 oz (90 kg)   LMP 12/20/2018   BMI 34.07 kg/m   HISTORY: OB History  Gravida Para Term Preterm AB Living  3 2 2  0   2  SAB TAB Ectopic Multiple Live Births  0 0 0 0 2    # Outcome Date GA Lbr Len/2nd Weight Sex Delivery Anes PTL Lv  3 Current           2 Term 10/11/17 [redacted]w[redacted]d  9 lb 2.7 oz (4.16 kg) M CS-LTranv Spinal  LIV     Birth Comments: No gross anomalies  1 Term 08/18/15 [redacted]w[redacted]d 16:26 / 00:34 8 lb 12.9 oz (3.994 kg) F Vag-Vacuum EPI  LIV     Birth Comments: none    Past Medical History:  Diagnosis Date  . Medical history non-contributory     Past Surgical History:  Procedure Laterality Date  . CESAREAN SECTION N/A 10/11/2017   Procedure: CESAREAN SECTION;  Surgeon: 10/13/2017, MD;  Location: [facility];  Service: Obstetrics;  Laterality: N/A;  . CESAREAN SECTION      No family history on file.   Exam  BP 135/73   Pulse 94   Temp 98.3 F (36.8 C)   Wt 198 lb 8 oz (90 kg)   LMP 12/20/2018   BMI 34.07 kg/m   CONSTITUTIONAL: Well-developed, well-nourished female in no acute distress.  HENT:  Normocephalic, atraumatic, External right and left ear normal. Oropharynx is clear and moist EYES: Conjunctivae and EOM are normal. Pupils are equal, round, and reactive to light. No scleral icterus.  NECK: Normal range of motion, supple, no masses.  Normal thyroid.  CARDIOVASCULAR: Normal heart rate noted, regular rhythm RESPIRATORY: Clear to auscultation bilaterally. Effort and breath sounds normal, no problems with respiration noted. BREASTS:  deferred ABDOMEN: Soft, normal bowel sounds, no distention noted.  No tenderness, rebound or guarding.  PELVIC: deferred MUSCULOSKELETAL: Normal range of motion. No tenderness.  No cyanosis, clubbing, or edema.  2+ distal pulses. SKIN: Skin is warm and dry. No rash noted. Not diaphoretic. No erythema. No pallor. NEUROLOGIC: Alert and oriented to person, place, and time. Normal reflexes, muscle tone coordination. No cranial nerve deficit noted. PSYCHIATRIC: Normal mood and affect. Normal behavior. Normal judgment and thought content.    Assessment:    Pregnancy: 02/18/2019 Patient Active Problem List   Diagnosis Date Noted  . Supervision of high risk pregnancy, antepartum 08/10/2019  . S/P cesarean section 10/11/2017  . Gestational diabetes mellitus 08/27/2017      Plan:   1. Supervision of high risk pregnancy, antepartum FHT and FH normal - 10/27/2017 MFM OB DETAIL +14 WK; Future - [facility] AMB BABYSCRIPTS SCHEDULE OPTIMIZATION - Obstetric Panel, Including HIV - Culture, OB Urine - GC/Chlamydia probe amp ([place])not at [facility]  2. History of gestational diabetes mellitus (GDM) 2hr GTT ASAP  3. History of cesarean delivery First delivery was via VAVD. Desires TOLAC. Discussed risks and consent signed.  4. Late prenatal care     Problem list reviewed and updated. 75% of 30 min  visit spent on counseling and coordination of care.     Truett Mainland 08/10/2019

## 2019-08-11 LAB — OBSTETRIC PANEL, INCLUDING HIV
Antibody Screen: NEGATIVE
Basophils Absolute: 0 10*3/uL (ref 0.0–0.2)
Basos: 0 %
EOS (ABSOLUTE): 0 10*3/uL (ref 0.0–0.4)
Eos: 0 %
HIV Screen 4th Generation wRfx: NONREACTIVE
Hematocrit: 31 % — ABNORMAL LOW (ref 34.0–46.6)
Hemoglobin: 9.9 g/dL — ABNORMAL LOW (ref 11.1–15.9)
Hepatitis B Surface Ag: NEGATIVE
Immature Grans (Abs): 0.1 10*3/uL (ref 0.0–0.1)
Immature Granulocytes: 1 %
Lymphocytes Absolute: 1.5 10*3/uL (ref 0.7–3.1)
Lymphs: 14 %
MCH: 25.6 pg — ABNORMAL LOW (ref 26.6–33.0)
MCHC: 31.9 g/dL (ref 31.5–35.7)
MCV: 80 fL (ref 79–97)
Monocytes Absolute: 0.7 10*3/uL (ref 0.1–0.9)
Monocytes: 7 %
Neutrophils Absolute: 8.1 10*3/uL — ABNORMAL HIGH (ref 1.4–7.0)
Neutrophils: 78 %
Platelets: 220 10*3/uL (ref 150–450)
RBC: 3.86 x10E6/uL (ref 3.77–5.28)
RDW: 14.1 % (ref 11.7–15.4)
RPR Ser Ql: NONREACTIVE
Rh Factor: POSITIVE
Rubella Antibodies, IGG: 2.04 index (ref >0.99)
WBC: 10.5 10*3/uL (ref 3.4–10.8)

## 2019-08-12 LAB — URINE CYTOLOGY ANCILLARY ONLY
Chlamydia: NEGATIVE
Neisseria Gonorrhea: NEGATIVE

## 2019-08-13 ENCOUNTER — Other Ambulatory Visit: Payer: Self-pay | Admitting: *Deleted

## 2019-08-13 DIAGNOSIS — O0990 Supervision of high risk pregnancy, unspecified, unspecified trimester: Secondary | ICD-10-CM

## 2019-08-13 DIAGNOSIS — O24419 Gestational diabetes mellitus in pregnancy, unspecified control: Secondary | ICD-10-CM

## 2019-08-13 DIAGNOSIS — Z8632 Personal history of gestational diabetes: Secondary | ICD-10-CM

## 2019-08-15 LAB — URINE CULTURE, OB REFLEX

## 2019-08-15 LAB — CULTURE, OB URINE

## 2019-08-17 ENCOUNTER — Other Ambulatory Visit: Payer: BLUE CROSS/BLUE SHIELD

## 2019-08-17 ENCOUNTER — Other Ambulatory Visit: Payer: Self-pay | Admitting: Family Medicine

## 2019-08-17 ENCOUNTER — Other Ambulatory Visit: Payer: Self-pay

## 2019-08-17 DIAGNOSIS — O0990 Supervision of high risk pregnancy, unspecified, unspecified trimester: Secondary | ICD-10-CM

## 2019-08-17 DIAGNOSIS — O24419 Gestational diabetes mellitus in pregnancy, unspecified control: Secondary | ICD-10-CM

## 2019-08-17 MED ORDER — AMOXICILLIN 875 MG PO TABS: 875.0000 mg | ORAL_TABLET | Freq: Two times a day (BID) | ORAL | 0 refills | Status: AC

## 2019-08-18 LAB — GLUCOSE TOLERANCE, 2 HOURS W/ 1HR
Glucose, 1 hour: 156 mg/dL (ref 65–179)
Glucose, 2 hour: 130 mg/dL (ref 65–152)
Glucose, Fasting: 91 mg/dL (ref 65–91)

## 2019-08-25 ENCOUNTER — Encounter: Payer: Self-pay | Admitting: *Deleted

## 2019-08-26 ENCOUNTER — Encounter (HOSPITAL_COMMUNITY): Payer: Self-pay

## 2019-08-26 ENCOUNTER — Ambulatory Visit (INDEPENDENT_AMBULATORY_CARE_PROVIDER_SITE_OTHER): Payer: BLUE CROSS/BLUE SHIELD | Admitting: Family Medicine

## 2019-08-26 ENCOUNTER — Other Ambulatory Visit: Payer: Self-pay

## 2019-08-26 ENCOUNTER — Ambulatory Visit (HOSPITAL_COMMUNITY): Payer: BLUE CROSS/BLUE SHIELD | Admitting: *Deleted

## 2019-08-26 ENCOUNTER — Ambulatory Visit (HOSPITAL_COMMUNITY)
Admission: RE | Admit: 2019-08-26 | Discharge: 2019-08-26 | Disposition: A | Discharge status: Home / Self Care | Payer: BLUE CROSS/BLUE SHIELD | Source: Ambulatory Visit | Attending: Obstetrics and Gynecology | Admitting: Obstetrics and Gynecology

## 2019-08-26 VITALS — BP 128/74 | HR 96 | Wt 202.9 lb

## 2019-08-26 DIAGNOSIS — O24410 Gestational diabetes mellitus in pregnancy, diet controlled: Secondary | ICD-10-CM

## 2019-08-26 DIAGNOSIS — Z98891 History of uterine scar from previous surgery: Secondary | ICD-10-CM

## 2019-08-26 DIAGNOSIS — O0933 Supervision of pregnancy with insufficient antenatal care, third trimester: Secondary | ICD-10-CM

## 2019-08-26 DIAGNOSIS — R8271 Bacteriuria: Secondary | ICD-10-CM | POA: Insufficient documentation

## 2019-08-26 DIAGNOSIS — O0990 Supervision of high risk pregnancy, unspecified, unspecified trimester: Secondary | ICD-10-CM | POA: Insufficient documentation

## 2019-08-26 DIAGNOSIS — Z3A35 35 weeks gestation of pregnancy: Secondary | ICD-10-CM

## 2019-08-26 DIAGNOSIS — O34219 Maternal care for unspecified type scar from previous cesarean delivery: Secondary | ICD-10-CM

## 2019-08-26 DIAGNOSIS — O0993 Supervision of high risk pregnancy, unspecified, third trimester: Secondary | ICD-10-CM

## 2019-08-26 DIAGNOSIS — O0930 Supervision of pregnancy with insufficient antenatal care, unspecified trimester: Secondary | ICD-10-CM

## 2019-08-26 DIAGNOSIS — Z8632 Personal history of gestational diabetes: Secondary | ICD-10-CM

## 2019-08-26 NOTE — Patient Instructions (Signed)

## 2019-08-27 NOTE — Progress Notes (Signed)
   PRENATAL VISIT NOTE  Subjective:  Alicia Avila is a 33 y.o. G3P2002 at [redacted]w[redacted]d being seen today for ongoing prenatal care.  She is currently monitored for the following issues for this high-risk pregnancy and has S/P cesarean section; Supervision of high risk pregnancy, antepartum; Late prenatal care; History of gestational diabetes mellitus (GDM); and Group B streptococcal bacteriuria on their problem list.  Patient reports no complaints.  Contractions: Not present. Vag. Bleeding: None.  Movement: Present. Denies leaking of fluid.   The following portions of the patient's history were reviewed and updated as appropriate: allergies, current medications, past family history, past medical history, past social history, past surgical history and problem list.   Objective:   Vitals:   08/26/19 1430  BP: 128/74  Pulse: 96  Weight: 202 lb 14.4 oz (92 kg)    Fetal Status: Fetal Heart Rate (bpm): 148 Fundal Height: 38 cm Movement: Present     General:  Alert, oriented and cooperative. Patient is in no acute distress.  Skin: Skin is warm and dry. No rash noted.   Cardiovascular: Normal heart rate noted  Respiratory: Normal respiratory effort, no problems with respiration noted  Abdomen: Soft, gravid, appropriate for gestational age.  Pain/Pressure: Present     Pelvic: Cervical exam deferred        Extremities: Normal range of motion.  Edema: Trace  Mental Status: Normal mood and affect. Normal behavior. Normal judgment and thought content.   Assessment and Plan:  Pregnancy: G3P2002 at [redacted]w[redacted]d 1. Supervision of high risk pregnancy, antepartum Continue prenatal care. Cultures next week   2. History of gestational diabetes mellitus (GDM) Passed 2 hour Continue to watch diet  3. Late prenatal care   4. S/P cesarean section SVD x 1, then C-S for breech Desires TOLAC, consent signed  5. Group B streptococcal bacteriuria Needs treatment in labor  Preterm labor symptoms and general  obstetric precautions including but not limited to vaginal bleeding, contractions, leaking of fluid and fetal movement were reviewed in detail with the patient. Please refer to After Visit Summary for other counseling recommendations.   Return in 1 week (on 09/02/2019) for in person.  Future Appointments  Date Time Provider Department Center  09/04/2019  8:55 AM Sloan Leiter, MD [facility]    Donnamae Jude, MD

## 2019-09-04 ENCOUNTER — Encounter: Payer: Self-pay | Admitting: Obstetrics and Gynecology

## 2019-09-04 ENCOUNTER — Other Ambulatory Visit: Payer: Self-pay

## 2019-09-04 ENCOUNTER — Ambulatory Visit (INDEPENDENT_AMBULATORY_CARE_PROVIDER_SITE_OTHER): Payer: BLUE CROSS/BLUE SHIELD | Admitting: Obstetrics and Gynecology

## 2019-09-04 VITALS — BP 115/78 | HR 98 | Temp 97.9°F | Wt 203.3 lb

## 2019-09-04 DIAGNOSIS — N898 Other specified noninflammatory disorders of vagina: Secondary | ICD-10-CM | POA: Diagnosis not present

## 2019-09-04 DIAGNOSIS — O0993 Supervision of high risk pregnancy, unspecified, third trimester: Secondary | ICD-10-CM | POA: Diagnosis not present

## 2019-09-04 DIAGNOSIS — Z113 Encounter for screening for infections with a predominantly sexual mode of transmission: Secondary | ICD-10-CM

## 2019-09-04 DIAGNOSIS — B9689 Other specified bacterial agents as the cause of diseases classified elsewhere: Secondary | ICD-10-CM

## 2019-09-04 DIAGNOSIS — O0990 Supervision of high risk pregnancy, unspecified, unspecified trimester: Secondary | ICD-10-CM

## 2019-09-04 DIAGNOSIS — N760 Acute vaginitis: Secondary | ICD-10-CM | POA: Diagnosis not present

## 2019-09-04 DIAGNOSIS — O99820 Streptococcus B carrier state complicating pregnancy: Secondary | ICD-10-CM

## 2019-09-04 DIAGNOSIS — B373 Candidiasis of vulva and vagina: Secondary | ICD-10-CM

## 2019-09-04 DIAGNOSIS — O34219 Maternal care for unspecified type scar from previous cesarean delivery: Secondary | ICD-10-CM

## 2019-09-04 DIAGNOSIS — Z98891 History of uterine scar from previous surgery: Secondary | ICD-10-CM

## 2019-09-04 DIAGNOSIS — R8271 Bacteriuria: Secondary | ICD-10-CM

## 2019-09-04 DIAGNOSIS — Z8632 Personal history of gestational diabetes: Secondary | ICD-10-CM

## 2019-09-04 DIAGNOSIS — Z3A36 36 weeks gestation of pregnancy: Secondary | ICD-10-CM

## 2019-09-04 MED ORDER — TERCONAZOLE 0.8 % VA CREA: 1.0000 | TOPICAL_CREAM | Freq: Every day | VAGINAL | 0 refills | Status: DC

## 2019-09-04 NOTE — Progress Notes (Addendum)
   PRENATAL VISIT NOTE  Subjective:  Alicia Avila is a 33 y.o. G3P2002 at [redacted]w[redacted]d being seen today for ongoing prenatal care.  She is currently monitored for the following issues for this high-risk pregnancy and has S/P cesarean section; Supervision of high risk pregnancy, antepartum; Late prenatal care; History of gestational diabetes mellitus (GDM); and Group B streptococcal bacteriuria on their problem list.  Patient reports vaginal itching.  Contractions: Not present. Vag. Bleeding: None.  Movement: Present. Denies leaking of fluid.   The following portions of the patient's history were reviewed and updated as appropriate: allergies, current medications, past family history, past medical history, past social history, past surgical history and problem list.   Objective:   Vitals:   09/04/19 0914  BP: 115/78  Pulse: 98  Temp: 97.9 F (36.6 C)  Weight: 203 lb 4.8 oz (92.2 kg)    Fetal Status: Fetal Heart Rate (bpm): 133   Movement: Present     General:  Alert, oriented and cooperative. Patient is in no acute distress.  Skin: Skin is warm and dry. No rash noted.   Cardiovascular: Normal heart rate noted  Respiratory: Normal respiratory effort, no problems with respiration noted  Abdomen: Soft, gravid, appropriate for gestational age.  Pain/Pressure: Present     Pelvic: Cervical exam deferred      thick white discharge at introitus  Extremities: Normal range of motion.  Edema: Trace  Mental Status: Normal mood and affect. Normal behavior. Normal judgment and thought content.   Pt informed that the ultrasound is considered a limited OB ultrasound and is not intended to be a complete ultrasound exam.  Patient also informed that the ultrasound is not being completed with the intent of assessing for fetal or placental anomalies or any pelvic abnormalities.  Explained that the purpose of today's ultrasound is to assess for  presentation.  Patient acknowledges the purpose of the exam and the  limitations of the study.    Bedside US: cephalic  Assessment and Plan:  Pregnancy: G3P2002 at [redacted]w[redacted]d  1. Supervision of high risk pregnancy, antepartum - wet prep - GC/Chlamydia probe amp ([place])not at [facility]  2. S/P cesarean section For TOLAC  3. History of gestational diabetes mellitus (GDM) GTT wnl this pregnancy  4. Group B streptococcal bacteriuria ppx in labor  Preterm labor symptoms and general obstetric precautions including but not limited to vaginal bleeding, contractions, leaking of fluid and fetal movement were reviewed in detail with the patient. Please refer to After Visit Summary for other counseling recommendations.   Return in about 1 week (around 09/11/2019) for low OB, in person.  Future Appointments  Date Time Provider [place]  09/11/2019  9:15 AM Lavonia Drafts, MD [facility]    Sloan Leiter, MD

## 2019-09-08 LAB — CERVICOVAGINAL ANCILLARY ONLY
Bacterial Vaginitis (gardnerella): POSITIVE — AB
Candida Glabrata: NEGATIVE
Candida Vaginitis: POSITIVE — AB
Chlamydia: NEGATIVE
Comment: NEGATIVE
Comment: NEGATIVE
Comment: NEGATIVE
Comment: NEGATIVE
Comment: NORMAL
Neisseria Gonorrhea: NEGATIVE

## 2019-09-11 ENCOUNTER — Other Ambulatory Visit: Payer: Self-pay

## 2019-09-11 ENCOUNTER — Encounter: Payer: Self-pay | Admitting: Obstetrics & Gynecology

## 2019-09-11 ENCOUNTER — Telehealth (INDEPENDENT_AMBULATORY_CARE_PROVIDER_SITE_OTHER): Payer: BLUE CROSS/BLUE SHIELD | Admitting: Obstetrics & Gynecology

## 2019-09-11 VITALS — BP 116/87 | HR 92 | Wt 204.0 lb

## 2019-09-11 DIAGNOSIS — O0990 Supervision of high risk pregnancy, unspecified, unspecified trimester: Secondary | ICD-10-CM

## 2019-09-11 DIAGNOSIS — Z3A37 37 weeks gestation of pregnancy: Secondary | ICD-10-CM

## 2019-09-11 DIAGNOSIS — O0993 Supervision of high risk pregnancy, unspecified, third trimester: Secondary | ICD-10-CM

## 2019-09-11 DIAGNOSIS — O0933 Supervision of pregnancy with insufficient antenatal care, third trimester: Secondary | ICD-10-CM

## 2019-09-11 DIAGNOSIS — Z8632 Personal history of gestational diabetes: Secondary | ICD-10-CM

## 2019-09-11 DIAGNOSIS — Z98891 History of uterine scar from previous surgery: Secondary | ICD-10-CM

## 2019-09-11 DIAGNOSIS — O0930 Supervision of pregnancy with insufficient antenatal care, unspecified trimester: Secondary | ICD-10-CM

## 2019-09-11 DIAGNOSIS — R8271 Bacteriuria: Secondary | ICD-10-CM

## 2019-09-11 MED ORDER — METRONIDAZOLE 500 MG PO TABS: 500.0000 mg | ORAL_TABLET | Freq: Two times a day (BID) | ORAL | 0 refills | Status: DC

## 2019-09-11 NOTE — Patient Instructions (Signed)
Levonorgestrel intrauterine device (IUD) What is this medicine? LEVONORGESTREL IUD (LEE voe nor jes trel) is a contraceptive (birth control) device. The device is placed inside the uterus by a healthcare professional. It is used to prevent pregnancy. This device can also be used to treat heavy bleeding that occurs during your period. This medicine may be used for other purposes; ask your health care provider or pharmacist if you have questions. COMMON BRAND NAME(S): Kyleena, LILETTA, Mirena, Skyla What should I tell my health care provider before I take this medicine? They need to know if you have any of these conditions:  abnormal Pap smear  cancer of the breast, uterus, or cervix  diabetes  endometritis  genital or pelvic infection now or in the past  have more than one sexual partner or your partner has more than one partner  heart disease  history of an ectopic or tubal pregnancy  immune system problems  IUD in place  liver disease or tumor  problems with blood clots or take blood-thinners  seizures  use intravenous drugs  uterus of unusual shape  vaginal bleeding that has not been explained  an unusual or allergic reaction to levonorgestrel, other hormones, silicone, or polyethylene, medicines, foods, dyes, or preservatives  pregnant or trying to get pregnant  breast-feeding How should I use this medicine? This device is placed inside the uterus by a health care professional. Talk to your pediatrician regarding the use of this medicine in children. Special care may be needed. Overdosage: If you think you have taken too much of this medicine contact a poison control center or emergency room at once. NOTE: This medicine is only for you. Do not share this medicine with others. What if I miss a dose? This does not apply. Depending on the brand of device you have inserted, the device will need to be replaced every 3 to 6 years if you wish to continue using this type  of birth control. What may interact with this medicine? Do not take this medicine with any of the following medications:  amprenavir  bosentan  fosamprenavir This medicine may also interact with the following medications:  aprepitant  armodafinil  barbiturate medicines for inducing sleep or treating seizures  bexarotene  boceprevir  griseofulvin  medicines to treat seizures like carbamazepine, ethotoin, felbamate, oxcarbazepine, phenytoin, topiramate  modafinil  pioglitazone  rifabutin  rifampin  rifapentine  some medicines to treat HIV infection like atazanavir, efavirenz, indinavir, lopinavir, nelfinavir, tipranavir, ritonavir  St. John's wort  warfarin This list may not describe all possible interactions. Give your health care provider a list of all the medicines, herbs, non-prescription drugs, or dietary supplements you use. Also tell them if you smoke, drink alcohol, or use illegal drugs. Some items may interact with your medicine. What should I watch for while using this medicine? Visit your doctor or health care professional for regular check ups. See your doctor if you or your partner has sexual contact with others, becomes HIV positive, or gets a sexual transmitted disease. This product does not protect you against HIV infection (AIDS) or other sexually transmitted diseases. You can check the placement of the IUD yourself by reaching up to the top of your vagina with clean fingers to feel the threads. Do not pull on the threads. It is a good habit to check placement after each menstrual period. Call your doctor right away if you feel more of the IUD than just the threads or if you cannot feel the threads at   all. The IUD may come out by itself. You may become pregnant if the device comes out. If you notice that the IUD has come out use a backup birth control method like condoms and call your health care provider. Using tampons will not change the position of the  IUD and are okay to use during your period. This IUD can be safely scanned with magnetic resonance imaging (MRI) only under specific conditions. Before you have an MRI, tell your healthcare provider that you have an IUD in place, and which type of IUD you have in place. What side effects may I notice from receiving this medicine? Side effects that you should report to your doctor or health care professional as soon as possible:  allergic reactions like skin rash, itching or hives, swelling of the face, lips, or tongue  fever, flu-like symptoms  genital sores  high blood pressure  no menstrual period for 6 weeks during use  pain, swelling, warmth in the leg  pelvic pain or tenderness  severe or sudden headache  signs of pregnancy  stomach cramping  sudden shortness of breath  trouble with balance, talking, or walking  unusual vaginal bleeding, discharge  yellowing of the eyes or skin Side effects that usually do not require medical attention (report to your doctor or health care professional if they continue or are bothersome):  acne  breast pain  change in sex drive or performance  changes in weight  cramping, dizziness, or faintness while the device is being inserted  headache  irregular menstrual bleeding within first 3 to 6 months of use  nausea This list may not describe all possible side effects. Call your doctor for medical advice about side effects. You may report side effects to FDA at 1-800-FDA-1088. Where should I keep my medicine? This does not apply. NOTE: This sheet is a summary. It may not cover all possible information. If you have questions about this medicine, talk to your doctor, pharmacist, or health care provider.  2020 Elsevier/Gold Standard (2018-09-16 13:22:01)  

## 2019-09-11 NOTE — Progress Notes (Signed)
I connected with  Alicia Avila on 09/11/19 at  9:15 AM EDT by telephone and verified that I am speaking with the correct person using two identifiers.   I discussed the limitations, risks, security and privacy concerns of performing an evaluation and management service by telephone and the availability of in person appointments. I also discussed with the patient that there may be a patient responsible charge related to this service. The patient expressed understanding and agreed to proceed.  Sells, [place] 09/11/2019  9:08 AM

## 2019-09-11 NOTE — Progress Notes (Signed)
TELEHEALTH OBSTETRICS PRENATAL VIRTUAL VIDEO VISIT ENCOUNTER NOTE  Provider [facility]: [facility] [place]   I connected with Alicia Avila on 09/11/19 at  9:15 AM EDT by MyChart Video Encounter at home and verified that I am speaking with the correct person using two identifiers.   I discussed the limitations, risks, security and privacy concerns of performing an evaluation and management service virtually and the availability of in person appointments. I also discussed with the patient that there may be a patient responsible charge related to this service. The patient expressed understanding and agreed to proceed. Subjective:  Alicia Avila is a 33 y.o. G3P2002 at 2230w6d being seen today for ongoing prenatal care.  She is currently monitored for the following issues for this high-risk pregnancy and has S/P cesarean section; Supervision of high risk pregnancy, antepartum; Late prenatal care; History of gestational diabetes mellitus (GDM); and Group B streptococcal bacteriuria on their problem list.  Patient reports no complaints.  Contractions: Not present. Vag. Bleeding: None.  Movement: Present. Denies any leaking of fluid.   The following portions of the patient's history were reviewed and updated as appropriate: allergies, current medications, past family history, past medical history, past social history, past surgical history and problem list.   Objective:   Vitals:   09/11/19 0909  BP: 116/87  Pulse: 92  Weight: 204 lb (92.5 kg)    Fetal Status:     Movement: Present     General:  Alert, oriented and cooperative. Patient is in no acute distress.  Respiratory: Normal respiratory effort, no problems with respiration noted  Mental Status: Normal mood and affect. Normal behavior. Normal judgment and thought content.  Rest of physical exam deferred due to type of encounter  Imaging: Kore[place] Mfm Ob Detail +14 Wk  Result Date: 08/26/2019  ----------------------------------------------------------------------  OBSTETRICS REPORT                       (Signed Final 08/26/2019 04:11 pm) ---------------------------------------------------------------------- Patient Info  ID #:       [id number]                          D.O.B.:  July 27, 1986 (32 yrs)  Name:       Alicia Avila                   Visit Date: 08/26/2019 03:10 pm ---------------------------------------------------------------------- Performed By  Performed By:     Earley BrookeNicole S Dalrymple     Ref. Address:     [address]                    [place]                                                             [address]  Attending:        Ma RingsVictor Fang MD         [facility]  Referred By:      [facility] ---------------------------------------------------------------------- Orders   #  Description                          Code         Ordered By   1  [place] MFM OB DETAIL +14 WK              76811.01     JACOB STINSON  ----------------------------------------------------------------------   #  Order #                    Accession #                 Episode #   1  [id number]                  [id number]                  [id number]  ---------------------------------------------------------------------- Indications   Late to prenatal care, third trimester         O09.33   (Entered at 27 weeks by LMP)   Previous cesarean delivery, antepartum         O34.219   Encounter for antenatal screening for          Z36.3   malformations   Gestational diabetes in pregnancy, diet        O24.410   controlled   [redacted] weeks gestation of pregnancy                Z3A.35  ---------------------------------------------------------------------- Fetal Evaluation  Num Of Fetuses:         1  Fetal Heart Rate(bpm):  134  Cardiac Activity:       Observed  Presentation:           Cephalic  Placenta:               Posterior  P. Cord Insertion:      Not  well visualized  Amniotic Fluid  AFI FV:      Within normal limits  AFI Sum(cm)     %Tile       Largest Pocket(cm)  20.86           78          7.15  RUQ(cm)       RLQ(cm)       LUQ(cm)        LLQ(cm)  4.67          7.15          3.5            5.54 ---------------------------------------------------------------------- Biometry  BPD:        96  mm     G. Age:  39w 1d       > 99  %    CI:        79.46   %    70 - 86                                                          FL/HC:      19.6   %    20.1 - 22.1  HC:      340.4  mm     G. Age:  39w 1d         93  %    HC/AC:      1.04        0.93 - 1.11  AC:      325.8  mm     G. Age:  36w 3d         82  %    FL/BPD:     69.4   %    71 - 87  FL:       66.6  mm     G. Age:  34w 2d         15  %    FL/AC:      20.4   %    20 - 24  HUM:      60.9  mm     G. Age:  35w 2d         59  %  LV:        3.7  mm  Est. FW:    2957  gm      6 lb 8 oz     75  % ---------------------------------------------------------------------- OB History  Gravidity:    3         Term:   2        Prem:   0        SAB:   0  TOP:          0       Ectopic:  0        Living: 2 ---------------------------------------------------------------------- Gestational Age  LMP:           35w 4d        Date:  12/20/18                 EDD:   09/26/19  U/S Today:     37w 2d                                        EDD:   09/14/19  Best:          35w 4d     Det. By:  LMP  (12/20/18)          EDD:   09/26/19 ---------------------------------------------------------------------- Anatomy  Cranium:               Appears normal         LVOT:                   Not well visualized  Cavum:                 Appears normal         Aortic Arch:            Appears normal  Ventricles:            Appears normal         Ductal Arch:            Not well visualized  Choroid Plexus:        Appears normal         Diaphragm:              Not well visualized  Cerebellum:            Appears normal  Stomach:                Appears  normal, left                                                                        sided  Posterior Fossa:       Appears normal         Abdomen:                Appears normal  Nuchal Fold:           Not applicable (>20    Abdominal Wall:         Not well visualized                         wks GA)  Face:                  Appears normal         Cord Vessels:           Appears normal (3                         (orbits and profile)                           vessel cord)  Lips:                  Appears normal         Kidneys:                Appear normal  Palate:                Not well visualized    Bladder:                Appears normal  Thoracic:              Appears normal         Spine:                  Limited views                                                                        appear normal  Heart:                 Appears normal         Upper Extremities:      Present                         (4CH, axis, and                         situs)  RVOT:                  Not  well visualized    Lower Extremities:      Present  Other:  Fetus appears to be a female. Technically difficult due to maternal          habitus and fetal position. ---------------------------------------------------------------------- Cervix Uterus Adnexa  Cervix  Not visualized (advanced GA >24wks)  Left Ovary  Within normal limits.  Right Ovary  Within normal limits.  Adnexa  No abnormality visualized. ---------------------------------------------------------------------- Comments  This patient was seen for a detailed fetal anatomy scan as  she presented late for prenatal care in her current pregnancy.  She reports that she has screened negative for gestational  diabetes in her current pregnancy.  She reports that her first  pregnancy was a full-term vaginal delivery of an infant  weighing 8-1/2 pounds.  Her second pregnancy was a  cesarean delivery due to fetal malpresentation.  As the patient presented late for prenatal care, she has not   had any screening tests for fetal aneuploidy performed in her  current pregnancy.  She was informed that the fetal growth and amniotic fluid  level were appropriate for her gestational age.  The views of the fetal anatomy were limited today due to her  advanced gestational age.  The patient was informed that anomalies may be missed due  to technical limitations. If the fetus is in a suboptimal position  or maternal habitus is increased, visualization of the fetus in  the maternal uterus may be impaired.  The patient was advised that due to her history of a prior  vaginal birth in her first pregnancy followed by a cesarean  delivery due to fetal malpresentation, she would be a good  candidate for a TOLAC.  No further exams were scheduled in our office. ----------------------------------------------------------------------                   Ma Rings, MD Electronically Signed Final Report   08/26/2019 04:11 pm ----------------------------------------------------------------------   Assessment and Plan:  Pregnancy: [id number] at [redacted]w[redacted]d 1. Supervision of high risk pregnancy, antepartum Good FM Reviewed contraception options.   2. S/P cesarean section S/p SVD followed by c/s for breech  3. Late prenatal care  4. History of gestational diabetes mellitus (GDM) no DM this pregnancy Walking 3x week  5. Group B streptococcal bacteriuria Needs atbx in labor.    Term labor symptoms and general obstetric precautions including but not limited to vaginal bleeding, contractions, leaking of fluid and fetal movement were reviewed in detail with the patient. I discussed the assessment and treatment plan with the patient. The patient was provided an opportunity to ask questions and all were answered. The patient agreed with the plan and demonstrated an understanding of the instructions. The patient was advised to call back or seek an in-person office evaluation/go to [facility]U at [facility] for any urgent  or concerning symptoms. Please refer to After Visit Summary for other counseling recommendations.   I provided 12 minutes of face-to-face time during this encounter.  Return in about 1 week (around 09/18/2019) for in person.  No future appointments.  Willodean Rosenthal, MD [facility]

## 2019-09-11 NOTE — Addendum Note (Signed)
Addended by: Vivien Rota on: 09/11/2019 03:20 PM   Modules accepted: Orders

## 2019-09-17 ENCOUNTER — Other Ambulatory Visit: Payer: Self-pay

## 2019-09-17 ENCOUNTER — Telehealth: Payer: Self-pay | Admitting: Obstetrics & Gynecology

## 2019-09-17 ENCOUNTER — Ambulatory Visit (INDEPENDENT_AMBULATORY_CARE_PROVIDER_SITE_OTHER): Payer: BLUE CROSS/BLUE SHIELD | Admitting: Obstetrics & Gynecology

## 2019-09-17 VITALS — BP 124/84 | HR 105 | Wt 204.2 lb

## 2019-09-17 DIAGNOSIS — O99210 Obesity complicating pregnancy, unspecified trimester: Secondary | ICD-10-CM

## 2019-09-17 DIAGNOSIS — O0990 Supervision of high risk pregnancy, unspecified, unspecified trimester: Secondary | ICD-10-CM

## 2019-09-17 DIAGNOSIS — O0933 Supervision of pregnancy with insufficient antenatal care, third trimester: Secondary | ICD-10-CM

## 2019-09-17 DIAGNOSIS — Z3A38 38 weeks gestation of pregnancy: Secondary | ICD-10-CM

## 2019-09-17 DIAGNOSIS — O0993 Supervision of high risk pregnancy, unspecified, third trimester: Secondary | ICD-10-CM

## 2019-09-17 DIAGNOSIS — O0930 Supervision of pregnancy with insufficient antenatal care, unspecified trimester: Secondary | ICD-10-CM

## 2019-09-17 DIAGNOSIS — R8271 Bacteriuria: Secondary | ICD-10-CM

## 2019-09-17 DIAGNOSIS — O99213 Obesity complicating pregnancy, third trimester: Secondary | ICD-10-CM

## 2019-09-17 DIAGNOSIS — Z98891 History of uterine scar from previous surgery: Secondary | ICD-10-CM

## 2019-09-17 DIAGNOSIS — Z8632 Personal history of gestational diabetes: Secondary | ICD-10-CM

## 2019-09-17 NOTE — Telephone Encounter (Signed)
Scheduled the patient for 40+4 day as the NST schedule was unavailable until that time.

## 2019-09-17 NOTE — Progress Notes (Signed)
   PRENATAL VISIT NOTE  Subjective:  Alicia Avila is a 33 y.o. G3P2002 at [redacted]w[redacted]d being seen today for ongoing prenatal care.  She is currently monitored for the following issues for this high-risk pregnancy and has S/P cesarean section; Supervision of high risk pregnancy, antepartum; Late prenatal care; History of gestational diabetes mellitus (GDM); and Group B streptococcal bacteriuria on their problem list.  Patient reports no complaints.  Contractions: Irritability. Vag. Bleeding: None.  Movement: Present. Denies leaking of fluid.   The following portions of the patient's history were reviewed and updated as appropriate: allergies, current medications, past family history, past medical history, past social history, past surgical history and problem list.   Objective:   Vitals:   09/17/19 0833  BP: 124/84  Pulse: (!) 105  Weight: 204 lb 3.2 oz (92.6 kg)    Fetal Status: Fetal Heart Rate (bpm): 151   Movement: Present     General:  Alert, oriented and cooperative. Patient is in no acute distress.  Skin: Skin is warm and dry. No rash noted.   Cardiovascular: Normal heart rate noted  Respiratory: Normal respiratory effort, no problems with respiration noted  Abdomen: Soft, gravid, appropriate for gestational age.  Pain/Pressure: Present     Pelvic: Cervical exam performed        Extremities: Normal range of motion.  Edema: Trace  Mental Status: Normal mood and affect. Normal behavior. Normal judgment and thought content.   Assessment and Plan:  Pregnancy: G3P2002 at [redacted]w[redacted]d 1. Supervision of high risk pregnancy, antepartum   2. S/P cesarean section - plans TOLAC  3. Group B streptococcal bacteriuria - treat in labor  4. Late prenatal care   5. History of gestational diabetes mellitus (GDM) - normal 2 hour GTT  6. Obesity in pregnancy   Term labor symptoms and general obstetric precautions including but not limited to vaginal bleeding, contractions, leaking of fluid and  fetal movement were reviewed in detail with the patient. Please refer to After Visit Summary for other counseling recommendations.   No follow-ups on file.  No future appointments.  Emily Filbert, MD

## 2019-09-22 ENCOUNTER — Inpatient Hospital Stay (HOSPITAL_COMMUNITY): Payer: BLUE CROSS/BLUE SHIELD | Admitting: Anesthesiology

## 2019-09-22 ENCOUNTER — Encounter (HOSPITAL_COMMUNITY): Payer: Self-pay

## 2019-09-22 ENCOUNTER — Other Ambulatory Visit: Payer: Self-pay

## 2019-09-22 ENCOUNTER — Inpatient Hospital Stay (HOSPITAL_COMMUNITY)
Admission: AD | Admit: 2019-09-22 | Discharge: 2019-09-23 | DRG: 807 | Disposition: A | Discharge status: Home / Self Care | Payer: BLUE CROSS/BLUE SHIELD | Attending: Family Medicine | Admitting: Family Medicine

## 2019-09-22 DIAGNOSIS — Z98891 History of uterine scar from previous surgery: Secondary | ICD-10-CM

## 2019-09-22 DIAGNOSIS — O34211 Maternal care for low transverse scar from previous cesarean delivery: Secondary | ICD-10-CM | POA: Diagnosis present

## 2019-09-22 DIAGNOSIS — O3663X0 Maternal care for excessive fetal growth, third trimester, not applicable or unspecified: Secondary | ICD-10-CM | POA: Diagnosis present

## 2019-09-22 DIAGNOSIS — R8271 Bacteriuria: Secondary | ICD-10-CM | POA: Diagnosis present

## 2019-09-22 DIAGNOSIS — O0993 Supervision of high risk pregnancy, unspecified, third trimester: Secondary | ICD-10-CM

## 2019-09-22 DIAGNOSIS — O0930 Supervision of pregnancy with insufficient antenatal care, unspecified trimester: Secondary | ICD-10-CM

## 2019-09-22 DIAGNOSIS — O3660X0 Maternal care for excessive fetal growth, unspecified trimester, not applicable or unspecified: Secondary | ICD-10-CM | POA: Diagnosis present

## 2019-09-22 DIAGNOSIS — Z8632 Personal history of gestational diabetes: Secondary | ICD-10-CM | POA: Diagnosis present

## 2019-09-22 DIAGNOSIS — O99824 Streptococcus B carrier state complicating childbirth: Secondary | ICD-10-CM | POA: Diagnosis present

## 2019-09-22 DIAGNOSIS — Z8639 Personal history of other endocrine, nutritional and metabolic disease: Secondary | ICD-10-CM | POA: Diagnosis not present

## 2019-09-22 DIAGNOSIS — Z20828 Contact with and (suspected) exposure to other viral communicable diseases: Secondary | ICD-10-CM | POA: Diagnosis present

## 2019-09-22 DIAGNOSIS — O99214 Obesity complicating childbirth: Secondary | ICD-10-CM | POA: Diagnosis present

## 2019-09-22 DIAGNOSIS — Z3A39 39 weeks gestation of pregnancy: Secondary | ICD-10-CM

## 2019-09-22 DIAGNOSIS — O0990 Supervision of high risk pregnancy, unspecified, unspecified trimester: Secondary | ICD-10-CM

## 2019-09-22 LAB — COMPREHENSIVE METABOLIC PANEL
ALT: 15 U/L (ref 0–44)
AST: 21 U/L (ref 15–41)
Albumin: 3.1 g/dL — ABNORMAL LOW (ref 3.5–5.0)
Alkaline Phosphatase: 180 U/L — ABNORMAL HIGH (ref 38–126)
Anion gap: 15 (ref 5–15)
BUN: 13 mg/dL (ref 6–20)
CO2: 16 mmol/L — ABNORMAL LOW (ref 22–32)
Calcium: 10 mg/dL (ref 8.9–10.3)
Chloride: 104 mmol/L (ref 98–111)
Creatinine, Ser: 0.67 mg/dL (ref 0.44–1.00)
GFR calc Af Amer: >60 mL/min (ref >60)
GFR calc non Af Amer: >60 mL/min (ref >60)
Glucose, Bld: 104 mg/dL — ABNORMAL HIGH (ref 70–99)
Potassium: 4.1 mmol/L (ref 3.5–5.1)
Sodium: 135 mmol/L (ref 135–145)
Total Bilirubin: 0.3 mg/dL (ref 0.3–1.2)
Total Protein: 6.7 g/dL (ref 6.5–8.1)

## 2019-09-22 LAB — CBC
HCT: 33.5 % — ABNORMAL LOW (ref 36.0–46.0)
Hemoglobin: 10.4 g/dL — ABNORMAL LOW (ref 12.0–15.0)
MCH: 25.1 pg — ABNORMAL LOW (ref 26.0–34.0)
MCHC: 31 g/dL (ref 30.0–36.0)
MCV: 80.9 fL (ref 80.0–100.0)
Platelets: 221 10*3/uL (ref 150–400)
RBC: 4.14 MIL/uL (ref 3.87–5.11)
RDW: 15.7 % — ABNORMAL HIGH (ref 11.5–15.5)
WBC: 10.8 10*3/uL — ABNORMAL HIGH (ref 4.0–10.5)
nRBC: 0 % (ref 0.0–0.2)

## 2019-09-22 LAB — ABO/RH: ABO/RH(D): O POS

## 2019-09-22 LAB — TYPE AND SCREEN
ABO/RH(D): O POS
Antibody Screen: NEGATIVE

## 2019-09-22 LAB — SARS CORONAVIRUS 2 BY RT PCR (HOSPITAL ORDER, PERFORMED IN ~~LOC~~ HOSPITAL LAB): SARS Coronavirus 2: NEGATIVE

## 2019-09-22 LAB — RPR: RPR Ser Ql: NONREACTIVE

## 2019-09-22 MED ORDER — FENTANYL-BUPIVACAINE-NACL 0.5-0.125-0.9 MG/250ML-% EP SOLN
12.0000 mL/h | EPIDURAL | Status: DC | PRN
  Filled 2019-09-22: qty 250

## 2019-09-22 MED ORDER — DIBUCAINE (PERIANAL) 1 % EX OINT: 1.0000 "application " | TOPICAL_OINTMENT | CUTANEOUS | Status: DC | PRN

## 2019-09-22 MED ORDER — FENTANYL CITRATE (PF) 100 MCG/2ML IJ SOLN
100.0000 ug | INTRAMUSCULAR | Status: DC | PRN
  Administered 2019-09-22: 09:00:00 100 ug via INTRAVENOUS

## 2019-09-22 MED ORDER — LACTATED RINGERS IV SOLN: 500.0000 mL | INTRAVENOUS | Status: DC | PRN

## 2019-09-22 MED ORDER — ONDANSETRON HCL 4 MG PO TABS: 4.0000 mg | ORAL_TABLET | ORAL | Status: DC | PRN

## 2019-09-22 MED ORDER — OXYTOCIN 40 UNITS IN NORMAL SALINE INFUSION - SIMPLE MED: 2.5000 [IU]/h | INTRAVENOUS | Status: DC

## 2019-09-22 MED ORDER — IBUPROFEN 600 MG PO TABS
600.0000 mg | ORAL_TABLET | Freq: Four times a day (QID) | ORAL | Status: DC
  Administered 2019-09-22 – 2019-09-23 (×4): 600 mg via ORAL
  Filled 2019-09-22 (×4): qty 1

## 2019-09-22 MED ORDER — BUPIVACAINE HCL (PF) 0.25 % IJ SOLN
INTRAMUSCULAR | Status: DC | PRN
  Administered 2019-09-22 (×2): 5 mL via EPIDURAL

## 2019-09-22 MED ORDER — ONDANSETRON HCL 4 MG/2ML IJ SOLN: 4.0000 mg | Freq: Four times a day (QID) | INTRAMUSCULAR | Status: DC | PRN

## 2019-09-22 MED ORDER — SIMETHICONE 80 MG PO CHEW: 80.0000 mg | CHEWABLE_TABLET | ORAL | Status: DC | PRN

## 2019-09-22 MED ORDER — OXYCODONE-ACETAMINOPHEN 5-325 MG PO TABS: 2.0000 | ORAL_TABLET | ORAL | Status: DC | PRN

## 2019-09-22 MED ORDER — ACETAMINOPHEN 325 MG PO TABS: 650.0000 mg | ORAL_TABLET | ORAL | Status: DC | PRN

## 2019-09-22 MED ORDER — EPHEDRINE 5 MG/ML INJ: 10.0000 mg | INTRAVENOUS | Status: DC | PRN

## 2019-09-22 MED ORDER — WITCH HAZEL-GLYCERIN EX PADS: 1.0000 "application " | MEDICATED_PAD | CUTANEOUS | Status: DC | PRN

## 2019-09-22 MED ORDER — ONDANSETRON HCL 4 MG/2ML IJ SOLN: 4.0000 mg | INTRAMUSCULAR | Status: DC | PRN

## 2019-09-22 MED ORDER — MEASLES, MUMPS & RUBELLA VAC IJ SOLR: 0.5000 mL | Freq: Once | INTRAMUSCULAR | Status: DC

## 2019-09-22 MED ORDER — TERBUTALINE SULFATE 1 MG/ML IJ SOLN: 0.2500 mg | Freq: Once | INTRAMUSCULAR | Status: DC | PRN

## 2019-09-22 MED ORDER — PENICILLIN G POT IN DEXTROSE 60000 UNIT/ML IV SOLN
3.0000 10*6.[IU] | INTRAVENOUS | Status: DC
  Administered 2019-09-22: 3 10*6.[IU] via INTRAVENOUS
  Filled 2019-09-22 (×3): qty 50

## 2019-09-22 MED ORDER — TETANUS-DIPHTH-ACELL PERTUSSIS 5-2.5-18.5 LF-MCG/0.5 IM SUSP: 0.5000 mL | Freq: Once | INTRAMUSCULAR | Status: DC

## 2019-09-22 MED ORDER — LACTATED RINGERS IV SOLN
INTRAVENOUS | Status: DC
  Administered 2019-09-22: 07:00:00 via INTRAVENOUS

## 2019-09-22 MED ORDER — SODIUM CHLORIDE (PF) 0.9 % IJ SOLN
INTRAMUSCULAR | Status: DC | PRN
  Administered 2019-09-22: 12 mL/h via EPIDURAL

## 2019-09-22 MED ORDER — PRENATAL MULTIVITAMIN CH
1.0000 | ORAL_TABLET | Freq: Every day | ORAL | Status: DC
  Administered 2019-09-23: 12:00:00 1 via ORAL
  Filled 2019-09-22: qty 1

## 2019-09-22 MED ORDER — FENTANYL CITRATE (PF) 100 MCG/2ML IJ SOLN
INTRAMUSCULAR | Status: AC
  Administered 2019-09-22: 100 ug via INTRAVENOUS
  Filled 2019-09-22: qty 2

## 2019-09-22 MED ORDER — TRANEXAMIC ACID-NACL 1000-0.7 MG/100ML-% IV SOLN
1000.0000 mg | Freq: Once | INTRAVENOUS | Status: AC
  Administered 2019-09-22: 13:00:00 1000 mg via INTRAVENOUS

## 2019-09-22 MED ORDER — LIDOCAINE HCL (PF) 1 % IJ SOLN
INTRAMUSCULAR | Status: DC | PRN
  Administered 2019-09-22 (×2): 5 mL via EPIDURAL

## 2019-09-22 MED ORDER — SODIUM CHLORIDE 0.9 % IV SOLN
5.0000 10*6.[IU] | Freq: Once | INTRAVENOUS | Status: AC
  Administered 2019-09-22: 5 10*6.[IU] via INTRAVENOUS
  Filled 2019-09-22: qty 5

## 2019-09-22 MED ORDER — SOD CITRATE-CITRIC ACID 500-334 MG/5ML PO SOLN: 30.0000 mL | ORAL | Status: DC | PRN

## 2019-09-22 MED ORDER — PHENYLEPHRINE 40 MCG/ML (10ML) SYRINGE FOR IV PUSH (FOR BLOOD PRESSURE SUPPORT)
80.0000 ug | PREFILLED_SYRINGE | INTRAVENOUS | Status: DC | PRN
  Filled 2019-09-22: qty 10

## 2019-09-22 MED ORDER — OXYTOCIN BOLUS FROM INFUSION
500.0000 mL | Freq: Once | INTRAVENOUS | Status: AC
  Administered 2019-09-22: 500 mL via INTRAVENOUS

## 2019-09-22 MED ORDER — ZOLPIDEM TARTRATE 5 MG PO TABS: 5.0000 mg | ORAL_TABLET | Freq: Every evening | ORAL | Status: DC | PRN

## 2019-09-22 MED ORDER — OXYCODONE HCL 5 MG PO TABS: 5.0000 mg | ORAL_TABLET | ORAL | Status: DC | PRN

## 2019-09-22 MED ORDER — COCONUT OIL OIL
1.0000 "application " | TOPICAL_OIL | Status: DC | PRN
  Administered 2019-09-23: 1 via TOPICAL

## 2019-09-22 MED ORDER — OXYCODONE-ACETAMINOPHEN 5-325 MG PO TABS: 1.0000 | ORAL_TABLET | ORAL | Status: DC | PRN

## 2019-09-22 MED ORDER — LIDOCAINE HCL (PF) 1 % IJ SOLN: 30.0000 mL | INTRAMUSCULAR | Status: DC | PRN

## 2019-09-22 MED ORDER — DIPHENHYDRAMINE HCL 50 MG/ML IJ SOLN: 12.5000 mg | INTRAMUSCULAR | Status: DC | PRN

## 2019-09-22 MED ORDER — LACTATED RINGERS IV SOLN: 500.0000 mL | Freq: Once | INTRAVENOUS | Status: DC

## 2019-09-22 MED ORDER — DIPHENHYDRAMINE HCL 25 MG PO CAPS: 25.0000 mg | ORAL_CAPSULE | Freq: Four times a day (QID) | ORAL | Status: DC | PRN

## 2019-09-22 MED ORDER — PHENYLEPHRINE 40 MCG/ML (10ML) SYRINGE FOR IV PUSH (FOR BLOOD PRESSURE SUPPORT): 80.0000 ug | PREFILLED_SYRINGE | INTRAVENOUS | Status: DC | PRN

## 2019-09-22 MED ORDER — TRANEXAMIC ACID-NACL 1000-0.7 MG/100ML-% IV SOLN
INTRAVENOUS | Status: AC
  Administered 2019-09-22: 1000 mg via INTRAVENOUS
  Filled 2019-09-22: qty 100

## 2019-09-22 MED ORDER — SENNOSIDES-DOCUSATE SODIUM 8.6-50 MG PO TABS
2.0000 | ORAL_TABLET | ORAL | Status: DC
  Administered 2019-09-22: 2 via ORAL
  Filled 2019-09-22: qty 2

## 2019-09-22 MED ORDER — OXYTOCIN 40 UNITS IN NORMAL SALINE INFUSION - SIMPLE MED
1.0000 m[IU]/min | INTRAVENOUS | Status: DC
  Administered 2019-09-22: 2 m[IU]/min via INTRAVENOUS
  Filled 2019-09-22: qty 1000

## 2019-09-22 MED ORDER — BENZOCAINE-MENTHOL 20-0.5 % EX AERO
1.0000 "application " | INHALATION_SPRAY | CUTANEOUS | Status: DC | PRN
  Administered 2019-09-22: 1 via TOPICAL
  Filled 2019-09-22: qty 56

## 2019-09-22 MED ORDER — PENICILLIN G 3 MILLION UNITS IVPB - SIMPLE MED
3.0000 10*6.[IU] | INTRAVENOUS | Status: DC
  Filled 2019-09-22 (×3): qty 100

## 2019-09-22 NOTE — MAU Note (Signed)
Spoke to DR Jones Bales about the patients cervical exam and strip, the plan is to watch the strip for a while longer and then recheck her cervix.

## 2019-09-22 NOTE — Progress Notes (Addendum)
Patient ID: Alicia Avila, female   DOB: [birth year], 33 y.o.   MRN: [id number]  Feeling rectal pressure; s/p PCN x 2 doses  BP 114/74, P 89 FHR 130s, +accels, no decels, occ variables Ctx q 2-3 mins with Pit @ 52mu/min Cx C/C/vtx +2, intact BBOW  IUP@39 .3wks TOLAC End 1st stage GBS pos  Push with ctx Adequately prophylaxed with PCN Anticipate successful VBAC  Myrtis Ser 09/22/2019 12:45 PM

## 2019-09-22 NOTE — Discharge Summary (Signed)
Postpartum Discharge Summary    Patient Name: Alicia Avila DOB: 03-Mar-1986 MRN: [id number]  Date of admission: 09/22/2019 Delivering Provider: Juanna Cao   Date of discharge: 09/23/2019  Admitting diagnosis: 41WKS CTX Intrauterine pregnancy: [redacted]w[redacted]d    Secondary diagnosis:  Principal Problem:   Supervision of high risk pregnancy, antepartum Active Problems:   S/P cesarean section   Late prenatal care   History of gestational diabetes mellitus (GDM)   Group B streptococcal bacteriuria   Supervision of high risk pregnancy in third trimester   Fetus large for gestational age, delivered, current hospitalization  Additional problems: none     Discharge diagnosis: Term Pregnancy Delivered, VBAC and GDM A1                                                                                                Post partum procedures:none  Augmentation: Pitocin  Complications: None  Hospital course:  Induction of Labor With Vaginal Delivery   33y.o. yo G3P3003 at 333w3das admitted to the hospital 09/22/2019 for induction of labor.  Indication for induction: FHR variables at term noted in the MAU when she presented for a labor check. Pitocin was started and she progressed to complete on a minimal amount, after receiving an epidural. FHR remained stable on L&D and she had a successful VBAC. PCN x 2 doses given for GBS ppx.  Membrane Rupture Time/Date: 11:48 AM ,09/22/2019   Intrapartum Procedures: Episiotomy: None [1]                                         Lacerations:  1st degree [2];Vaginal [6]  Patient had delivery of a Viable infant.  Information for the patient's newborn:  AdCrislyn, [place]0[[id number]]Delivery Method: VBAC, Spontaneous(Filed from Delivery Summary)    09/22/2019  Details of delivery can be found in separate delivery note.  Patient had a routine postpartum course. Patient is discharged home 09/23/19. Delivery time: 12:54 PM    Magnesium Sulfate received: No BMZ  received: No Rhophylac:N/A MMR:N/A Transfusion:No  Physical exam  Vitals:   09/22/19 1637 09/22/19 2030 09/23/19 0045 09/23/19 0515  BP: 126/76 119/72 120/66 114/72  Pulse: 100 86 85 96  Resp: 18 18 18 18   Temp: 98.6 F (37 C) 98 F (36.7 C) 98.3 F (36.8 C) 98.2 F (36.8 C)  TempSrc: Oral Oral Oral Oral  SpO2: 99%     Weight:      Height:       General: alert Lochia: appropriate Uterine Fundus: firm Incision: N/A DVT Evaluation: No evidence of DVT seen on physical exam. Labs: Lab Results  Component Value Date   WBC 10.8 (H) 09/22/2019   HGB 10.4 (L) 09/22/2019   HCT 33.5 (L) 09/22/2019   MCV 80.9 09/22/2019   PLT 221 09/22/2019   CMP Latest Ref Rng & Units 09/22/2019  Glucose 70 - 99 mg/dL 104(H)  BUN 6 - 20 mg/dL 13  Creatinine 0.44 - 1.00 mg/dL 0.67  Sodium 135 -  145 mmol/L 135  Potassium 3.5 - 5.1 mmol/L 4.1  Chloride 98 - 111 mmol/L 104  CO2 22 - 32 mmol/L 16(L)  Calcium 8.9 - 10.3 mg/dL 10.0  Total Protein 6.5 - 8.1 g/dL 6.7  Total Bilirubin 0.3 - 1.2 mg/dL 0.3  Alkaline Phos 38 - 126 U/L 180(H)  AST 15 - 41 U/L 21  ALT 0 - 44 U/L 15    Discharge instruction: per After Visit Summary and "Baby and Me Booklet".  After visit meds:  Allergies as of 09/23/2019   No Known Allergies     Medication List    STOP taking these medications   AMOXIL PO   metroNIDAZOLE 500 MG tablet Commonly known as: FLAGYL   Probiotic Caps   terconazole 0.8 % vaginal cream Commonly known as: TERAZOL 3     TAKE these medications   acetaminophen 325 MG tablet Commonly known as: Tylenol Take 2 tablets (650 mg total) by mouth every 4 (four) hours as needed (for pain scale < 4).   ibuprofen 600 MG tablet Commonly known as: ADVIL Take 1 tablet (600 mg total) by mouth every 6 (six) hours.   norethindrone 0.35 MG tablet Commonly known as: Ortho Micronor Take 1 tablet (0.35 mg total) by mouth daily.   polyethylene glycol powder 17 GM/SCOOP powder Commonly known as:  GLYCOLAX/MIRALAX Take 255 g by mouth once for 1 dose.   prenatal vitamin w/FE, FA 27-1 MG Tabs tablet Take 1 tablet by mouth daily.       Diet: routine diet  Activity: Advance as tolerated. Pelvic rest for 6 weeks.   Outpatient follow up:4 weeks with GTT Follow up Appt: Future Appointments  Date Time Provider [place]  10/20/2019 10:35 AM Darlina Rumpf, CNM [facility]  11/04/2019  8:20 AM [facility] LAB [facility] WOC   Follow up Visit:  Please schedule this patient for Postpartum visit in: 4 weeks with the following provider: Any provider  For C/S patients schedule nurse incision check in weeks 2 weeks: no  High risk pregnancy complicated by: prev C/S, GDMA1  Delivery mode: VBAC  Anticipated Birth Control: POPs  PP Procedures needed: GTT  Schedule Integrated [place] visit: no   Newborn Data: Live born female  Birth Weight: 9 lb 5.4 oz (4235 g) APGAR: 8, 9  Newborn Delivery   Birth date/time: 09/22/2019 12:54:00 Delivery type: VBAC, Spontaneous      Baby Feeding: Breast Disposition:home with mother   09/23/2019 Maxie Better, MD, PGY1 OBGYN Faculty Teaching Service    OB FELLOW ATTESTATION  I have seen and examined this patient and agree with above documentation in the resident's note.  Augustin Coupe, MD/MPH OB Fellow  09/23/2019, 8:09 AM

## 2019-09-22 NOTE — Anesthesia Procedure Notes (Signed)
Epidural Patient location during procedure: OB Start time: 09/22/2019 10:13 AM End time: 09/22/2019 10:27 AM  Staffing Anesthesiologist: Duane Boston, MD Performed: anesthesiologist   Preanesthetic Checklist Completed: patient identified, site marked, pre-op evaluation, timeout performed, IV checked, risks and benefits discussed and monitors and equipment checked  Epidural Patient position: sitting Prep: DuraPrep Patient monitoring: heart rate, cardiac monitor, continuous pulse ox and blood pressure Approach: midline Location: L2-L3 Injection technique: LOR saline  Needle:  Needle type: Tuohy  Needle gauge: 17 G Needle length: 9 cm Needle insertion depth: 6 cm Catheter size: 20 Guage Catheter at skin depth: 11 cm Test dose: negative and Other  Assessment Events: blood not aspirated, injection not painful, no injection resistance and negative IV test  Additional Notes Informed consent obtained prior to proceeding including risk of failure, 1% risk of PDPH, risk of minor discomfort and bruising.  Discussed rare but serious complications including epidural abscess, permanent nerve injury, epidural hematoma.  Discussed alternatives to epidural analgesia and patient desires to proceed.  Timeout performed pre-procedure verifying patient name, procedure, and platelet count.  Patient tolerated procedure well.

## 2019-09-22 NOTE — MAU Note (Signed)
PT SAYS UC  STRONG SINCE 0200.  [facility].  VE - 1-2 CM. DENIES HSV AND MRSA.  GBS- POSITIVE.  PLANS FOR VAG DEL.

## 2019-09-22 NOTE — Lactation Note (Signed)
This note was copied from a baby's chart. Lactation Consultation Note  Patient Name: Alicia Avila [id number] Date: 09/22/2019   Attempted to visit with mom twice today, the first time at 4:42 pm and the second time at 6:26 pm. Mom was in the bathroom the first time and when [facility] returned later, she was deeply asleep. LC to come back later to do initial assessment.   Maternal Data    Feeding Feeding Type: Breast Fed  LATCH Score Latch: Grasps breast easily, tongue down, lips flanged, rhythmical sucking.  Audible Swallowing: Spontaneous and intermittent  Type of Nipple: Everted at rest and after stimulation  Comfort (Breast/Nipple): Soft / non-tender  Hold (Positioning): Assistance needed to correctly position infant at breast and maintain latch.  LATCH Score: 9  Interventions Interventions: Breast feeding basics reviewed;Assisted with latch;Skin to skin;Hand express  Lactation Tools Discussed/Used     Consult Status      Caleah Tortorelli Francene Boyers 09/22/2019, 3:47 PM

## 2019-09-22 NOTE — H&P (Addendum)
LABOR AND DELIVERY ADMISSION HISTORY AND PHYSICAL NOTE  Alicia Avila is a 33 y.o. female G3P2002 with IUP at 5964w3d by LMP presenting for SOL with NRFT and TOLAC. Pregnancy complicated by GBS Bacteruria, h/o GDMA1, and obesity.   She reports positive fetal movement and painful contraction q 5-10 mins. She denies leakage of fluid or vaginal bleeding. She denies headaches, vision changes, chest pain, or lower extremity edema.   Prenatal History/Complications: GBS + H/O GDMA1- Screening normal during this pregnancy  TOLAC- Consent papers have been signed  LTCS -Scheduled section for 2nd delivery for breech presentation.  Obesity    Past Medical History: Past Medical History:  Diagnosis Date  . Medical history non-contributory     Past Surgical History: Past Surgical History:  Procedure Laterality Date  . CESAREAN SECTION N/A 10/11/2017   Procedure: CESAREAN SECTION;  Surgeon: Catalina Antiguaonstant, Peggy, MD;  Location: [facility];  Service: Obstetrics;  Laterality: N/A;  . CESAREAN SECTION      Obstetrical History: OB History    Gravida  3   Para  2   Term  2   Preterm  0   AB      Living  2     SAB  0   TAB  0   Ectopic  0   Multiple  0   Live Births  2           Social History: Social History   Socioeconomic History  . Marital status: Married    Spouse name: Not on file  . Number of children: Not on file  . Years of education: Not on file  . Highest education level: Not on file  Occupational History  . Not on file  Social Needs  . Financial resource strain: Not hard at all  . Food insecurity    Worry: Never true    Inability: Never true  . Transportation needs    Medical: No    Non-medical: No  Tobacco Use  . Smoking status: Never Smoker  . Smokeless tobacco: Never Used  Substance and Sexual Activity  . Alcohol use: No  . Drug use: No  . Sexual activity: Yes    Birth control/protection: None  Lifestyle  . Physical activity    Days per  week: 3 days    Minutes per session: 30 min  . Stress: Only a little  Relationships  . Social Musicianconnections    Talks on phone: Three times a week    Gets together: Once a week    Attends religious service: 1 to 4 times per year    Active member of club or organization: No    Attends meetings of clubs or organizations: Never    Relationship status: Married  Other Topics Concern  . Not on file  Social History Narrative  . Not on file    Family History: History reviewed. No pertinent family history.  Allergies: No Known Allergies  Medications Prior to Admission  Medication Sig Dispense Refill Last Dose  . Amoxicillin (AMOXIL PO) Take by mouth.   Past Month at Unknown time  . metroNIDAZOLE (FLAGYL) 500 MG tablet Take 1 tablet (500 mg total) by mouth 2 (two) times daily. 14 tablet 0 09/21/2019 at Unknown time  . prenatal vitamin w/FE, FA (PRENATAL 1 + 1) 27-1 MG TABS tablet Take 1 tablet by mouth daily.    09/21/2019 at Unknown time  . Probiotic CAPS Take 1 capsule by mouth daily.   09/21/2019 at Unknown  time  . terconazole (TERAZOL 3) 0.8 % vaginal cream Place 1 applicator vaginally at bedtime. Apply nightly for three nights. (Patient not taking: Reported on 09/11/2019) 20 g 0      Review of Systems   All systems reviewed and negative except as stated in HPI  Blood pressure 134/79, pulse (!) 102, temperature 98.5 F (36.9 C), temperature source Oral, resp. rate 18, height 5\' 4"  (1.626 m), weight 94 kg, last menstrual period 12/20/2018, unknown if currently breastfeeding.   General appearance: alert Lungs: clear to auscultation bilaterally Heart: regular rate and rhythm Abdomen: soft, non-tender; bowel sounds normal Extremities: No calf swelling or tenderness Presentation: cephalic by leopold's  Fetal monitoring: baseline 145, moderate variability (episodes of minimal variability after decels), no accelerations, variable decelerations and occasional lates.   Uterine activity:  q4-5 min Dilation: 3.5 Effacement (%): 80 Station: -1 Exam by:: Dr. Dione Plover   Prenatal labs: ABO, Rh: --/--/O POS (11/03 3009) Antibody: NEG (11/03 2330) Rubella: 2.04 (09/21 1435) RPR: Non Reactive (09/21 1435)  HBsAg: Negative (09/21 1435)  HIV: Non Reactive (09/21 1435)  GBS:   GBS positive  1 hr Glucola: WNL Genetic screening:  Declined  Anatomy US: 10/7- WNL. EFW 2957 gm 75%  Prenatal Transfer Tool  Maternal Diabetes: No Genetic Screening: Declined Maternal Ultrasounds/Referrals: Normal Fetal Ultrasounds or other Referrals:  Referred to Materal Fetal Medicine  Maternal Substance Abuse:  No Significant Maternal Medications:  None Significant Maternal Lab Results: Group B Strep positive  Results for orders placed or performed during the hospital encounter of 09/22/19 (from the past 24 hour(s))  CBC   Collection Time: 09/22/19  6:09 AM  Result Value Ref Range   WBC 10.8 (H) 4.0 - 10.5 K/uL   RBC 4.14 3.87 - 5.11 MIL/uL   Hemoglobin 10.4 (L) 12.0 - 15.0 g/dL   HCT 33.5 (L) 36.0 - 46.0 %   MCV 80.9 80.0 - 100.0 fL   MCH 25.1 (L) 26.0 - 34.0 pg   MCHC 31.0 30.0 - 36.0 g/dL   RDW 15.7 (H) 11.5 - 15.5 %   Platelets 221 150 - 400 K/uL   nRBC 0.0 0.0 - 0.2 %  Comprehensive metabolic panel   Collection Time: 09/22/19  6:09 AM  Result Value Ref Range   Sodium 135 135 - 145 mmol/L   Potassium 4.1 3.5 - 5.1 mmol/L   Chloride 104 98 - 111 mmol/L   CO2 16 (L) 22 - 32 mmol/L   Glucose, Bld 104 (H) 70 - 99 mg/dL   BUN 13 6 - 20 mg/dL   Creatinine, Ser 0.67 0.44 - 1.00 mg/dL   Calcium 10.0 8.9 - 10.3 mg/dL   Total Protein 6.7 6.5 - 8.1 g/dL   Albumin 3.1 (L) 3.5 - 5.0 g/dL   AST 21 15 - 41 U/L   ALT 15 0 - 44 U/L   Alkaline Phosphatase 180 (H) 38 - 126 U/L   Total Bilirubin 0.3 0.3 - 1.2 mg/dL   GFR calc non Af Amer >60 >60 mL/min   GFR calc Af Amer >60 >60 mL/min   Anion gap 15 5 - 15  Type and screen [place]   Collection Time: 09/22/19  6:09 AM   Result Value Ref Range   ABO/RH(D) O POS    Antibody Screen NEG    Sample Expiration      09/25/2019,2359 Performed at [facility], [address], [place], [place] [address]   SARS Coronavirus 2  by RT PCR (hospital order, performed in [facility] hospital lab) Nasopharyngeal Nasopharyngeal Swab   Collection Time: 09/22/19  6:19 AM   Specimen: Nasopharyngeal Swab  Result Value Ref Range   SARS Coronavirus 2 NEGATIVE NEGATIVE    Patient Active Problem List   Diagnosis Date Noted  . Supervision of high risk pregnancy in third trimester 09/22/2019  . Group B streptococcal bacteriuria 08/26/2019  . Supervision of high risk pregnancy, antepartum 08/10/2019  . Late prenatal care 08/10/2019  . History of gestational diabetes mellitus (GDM) 08/10/2019  . S/P cesarean section 10/11/2017    Assessment: Aarti Mankowski is a 33 y.o. female G3P2002 with IUP at [redacted]w[redacted]d by LMP presenting in SOL and admitted for NRFT and TOLAC. Pregnancy complicated by GBS Bacteruria, h/o GDMA1, and obesity.   #Labor: Presents in early labor for TOLAC. Dilated from fingertip to 3 cm on this exam. Will Augment with pitocin. #Pain: Epidural upon request  #FWB: Cat II s/p LR bolus 10/7- WNL. EFW 2957 gm 75%. Leopold's to 7.5 lbs and vertex.   #ID: GBS+ and treated with penicillin  #MOF: Breast  #MOC: POP  #Circ: Yes ([facility])   #TOLAC: Last birth was scheduled LTCS for breech presentation. VBAC risk score was 6 predicting 89% success rate for VBAC. Will continue with augmentation for expectant vaginal delivery.   [place]ly [place], PGY-1, MD Labor and Delivery Teaching service  09/22/2019, 7:55 AM   OB FELLOW ATTESTATION  I have seen and examined this patient and agree with above documentation in the resident's note except as noted below.  33 y/o G3P2 presenting in early labor with spontaneous progress, prior CS for breech, desires TOLAC and reaffirmed on admission, VBAC consent signed in clinic, VBAC  success calculator without race is 76%. Admission with Cat II tracing with lates and prolonged return to baseline, more recently has been Cat I with good variability and accels. Augment with pitocin. GBS+ on penicillin.   Zack Seal, MD/[facility] OB Fellow  09/22/2019, 8:20 AM

## 2019-09-22 NOTE — Progress Notes (Signed)
Tereka Thorley is a 33 y.o. female G3P2002 with IUP at [redacted]w[redacted]d by LMP presenting for SOL with NRFT and TOLAC. Pregnancy complicated by GBS Bacteruria, h/o GDMA1, and obesity.   Subjective: Reports moderate discomfort at this time with ctx, IV pain medication being administered by RN at this time. FOB present and supportive.  Objective: Vitals:   09/22/19 0446 09/22/19 0631 09/22/19 0646 09/22/19 0818  BP: 125/77 134/79  127/74  Pulse: (!) 102 (!) 102  91  Resp: 20  18 16   Temp: 98.4 F (36.9 C)  98.5 F (36.9 C)   TempSrc:   Oral   Weight: 94 kg     Height: 5\' 4"  (1.626 m)       No intake/output data recorded. No intake/output data recorded.   FHT:  FHR: 135 bpm, variability: moderate,  accelerations:  Present,  decelerations:  Present occasional mild variable decels UC:   regular, every 1.5-4 minutes SVE:   Dilation: 4 Effacement (%): 80, 90 Station: -1 Exam by:: Ignacia Felling, RN  Labs:   Recent Labs    09/22/19 0609  WBC 10.8*  HGB 10.4*  HCT 33.5*  PLT 221    Assessment / Plan: Ayaka Andes is a 33 y.o. female G3P2002 with IUP at [redacted]w[redacted]d by LMP presenting in SOL and admitted for NRFT and TOLAC. Pregnancy complicated by GBS Bacteruria, h/o GDMA1, and obesity.   Labor: Progressing on Pitocin, will continue to increase then AROM Preeclampsia:  no signs or symptoms of toxicity and labs stable Fetal Wellbeing:  Category I Pain Control:  IV pain meds - undecided on Epidural I/D:  GBS positive - GBS antibiotic prophylaxis per protocol Anticipated MOD:  NSVD  Juanna Cao, CNM, BSN 09/22/2019, 9:01 AM

## 2019-09-22 NOTE — Anesthesia Preprocedure Evaluation (Signed)
Anesthesia Evaluation  Patient identified by MRN, date of birth, ID band Patient awake    Reviewed: Allergy & Precautions, Patient's Chart, lab work & pertinent test results  History of Anesthesia Complications Negative for: history of anesthetic complications  Airway Mallampati: III  TM Distance: >3 FB Neck ROM: Full    Dental  (+) Teeth Intact   Pulmonary neg pulmonary ROS,    breath sounds clear to auscultation       Cardiovascular negative cardio ROS   Rhythm:Regular     Neuro/Psych negative neurological ROS  negative psych ROS   GI/Hepatic negative GI ROS, Neg liver ROS,   Endo/Other  Morbid obesity  Renal/GU negative Renal ROS     Musculoskeletal   Abdominal   Peds  Hematology negative hematology ROS (+)   Anesthesia Other Findings   Reproductive/Obstetrics (+) Pregnancy                             Anesthesia Physical  Anesthesia Plan  ASA: III  Anesthesia Plan: Epidural   Post-op Pain Management:    Induction:   PONV Risk Score and Plan:   Airway Management Planned: Natural Airway  Additional Equipment:   Intra-op Plan:   Post-operative Plan:   Informed Consent: I have reviewed the patients History and Physical, chart, labs and discussed the procedure including the risks, benefits and alternatives for the proposed anesthesia with the patient or authorized representative who has indicated his/her understanding and acceptance.     Dental advisory given  Plan Discussed with: Anesthesiologist  Anesthesia Plan Comments:         Anesthesia Quick Evaluation

## 2019-09-23 MED ORDER — NORETHINDRONE 0.35 MG PO TABS: 1.0000 | ORAL_TABLET | Freq: Every day | ORAL | 11 refills | Status: DC

## 2019-09-23 MED ORDER — ACETAMINOPHEN 325 MG PO TABS: 650.0000 mg | ORAL_TABLET | ORAL | 0 refills | Status: AC | PRN

## 2019-09-23 MED ORDER — IBUPROFEN 600 MG PO TABS: 600.0000 mg | ORAL_TABLET | Freq: Four times a day (QID) | ORAL | 0 refills | Status: DC

## 2019-09-23 MED ORDER — POLYETHYLENE GLYCOL 3350 17 GM/SCOOP PO POWD: 1.0000 | Freq: Once | ORAL | 0 refills | Status: AC

## 2019-09-23 NOTE — Discharge Instructions (Signed)

## 2019-09-23 NOTE — Lactation Note (Addendum)
This note was copied from a baby's chart. Lactation Consultation Note  Patient Name: Alicia Avila [id number] Date: 09/23/2019 Reason for consult: Initial assessment;Term P3, 7 hour female LGA infant greater than 9 lbs at birth. Mom receives [facility] in [place].  Mom has medela DEBP at home. Per mom, she breastfeed her other children. Infant reluctant to latch at first, mom hand expressed and infant was given 5 ml of colostrum by spoon. Mom latched infant on left breast using the football hold at first infant was reluctant to latch only held breast in mouth and taking only few suckles then stopping was on and off breast 10 minutes.  Mom been doing STS. Mom knows to breast feed according hunger cues, 8 to 12 times within 24 hours and on demand. Mom know if infant not latching to hand express and give infant back volume from spoon. Mom knows to call Nurse or [place] if she has any questions, concerns or need assistance with latching infant to breast. Mom made aware of O/P services, breastfeeding support groups, community resources, and our phone # for post-discharge questions.   Maternal Data Formula Feeding for Exclusion: No Has patient been taught Hand Expression?: Yes Does the patient have breastfeeding experience prior to this delivery?: Yes  Feeding Feeding Type: Breast Fed  LATCH Score Infant reluctant to latch, hold breast in mouth.  Audible Swallowing: Spontaneous and intermittent  Type of Nipple: Everted at rest and after stimulation  Comfort (Breast/Nipple): Soft / non-tender  Hold (Positioning): Assistance needed to correctly position infant at breast and maintain latch.  LATCH score : 7  Interventions Interventions: Breast feeding basics reviewed;Assisted with latch;Skin to skin;Breast massage;Hand express;Adjust position;Support pillows;Position options;Expressed milk  Lactation Tools Discussed/Used WIC Program: Yes   Consult Status Consult Status:  Follow-up Date: 09/23/19 Follow-up type: In-patient    Vicente Serene 09/23/2019, 12:23 AM

## 2019-09-23 NOTE — Anesthesia Postprocedure Evaluation (Signed)
Anesthesia Post Note  Patient: Alicia Avila  Procedure(s) Performed: AN AD HOC LABOR EPIDURAL     Patient location during evaluation: Mother Baby Anesthesia Type: Epidural Level of consciousness: awake Pain management: satisfactory to patient Vital Signs Assessment: post-procedure vital signs reviewed and stable Respiratory status: spontaneous breathing Cardiovascular status: stable Anesthetic complications: no    Last Vitals:  Vitals:   09/23/19 0045 09/23/19 0515  BP: 120/66 114/72  Pulse: 85 96  Resp: 18 18  Temp: 36.8 C 36.8 C  SpO2:      Last Pain:  Vitals:   09/23/19 0515  TempSrc: Oral  PainSc: 5    Pain Goal:                   Casimer Lanius

## 2019-09-23 NOTE — Lactation Note (Addendum)
This note was copied from a baby's chart. Lactation Consultation Note  Patient Name: Alicia Avila [id number] Date: 09/23/2019   [facility] entered room to find mom in bed. Baby lying in crib, stirring some. Mom asked for LC to return later as she is trying to rest at this time. Encouraged mom to call when she is ready.   Cranston Neighbor 09/23/2019, 10:17 AM

## 2019-09-23 NOTE — Clinical Social Work Maternal (Signed)
CLINICAL SOCIAL WORK MATERNAL/CHILD NOTE  Patient Details  Name: Alicia Avila MRN: [id number] Date of Birth: 10/19/1986  Date:  09/23/2019  Clinical Social Worker Initiating Note:  Durward Fortes, LCSW Date/Time: Initiated:  09/23/19/1020     Child's Name:  Alicia Avila   Biological Parents:  Mother, Father(Alicia Avila, Alicia Avila)   Need for Interpreter:  None   Reason for Referral:  Late or No Prenatal Care    Address:  [place]  [address]    Phone number:  [phone] (home)     Additional phone number: none   Household Members/Support Persons (HM/SP):   Household Member/Support Person 2, Household Member/Support Person 1   HM/SP Name Relationship DOB or Age  HM/SP -1 Audianna Vallecillo [place]  1  HM/SP -2 Alicia Avila  son   10/11/2017  HM/SP -3   Avila Hollinger  FOB     HM/SP -4   Alicia Avila  Daughter   08/18/2015  HM/SP -5        HM/SP -6        HM/SP -7        HM/SP -8          Natural Supports (not living in the home):  Parent   Professional Supports: Case Manager/Social Worker(with [place])   Employment: Animator   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   Strengths:  Ability to meet basic needs , Pediatrician chosen, Compliance with medical plan , Home prepared for child    Psychotropic Medications:   none       Pediatrician:    [place] area  Pediatrician List:   [facility] ([address])  [place]      Pediatrician Fax Number:    Risk Factors/Current Problems:  None   Cognitive State:  Alert , Able to Concentrate , Insightful    Mood/Affect:  Interested , Relaxed ,  Comfortable , Calm    CSW Assessment: CSW consulted as MOB entered care at 33 weeks. CSW went to speak with MOB at bedside to address further needs. Upon entering the room CSW congratulated MOB and FOB on the birth of infant. CSW advised MOB of the HIPPA policy and she expressed that it was fine for CSW to speak with MOB with FOB in the room. CSW understanding and advised MOB of CSW's role and the reason for CSW coming to visit with her. MOB reported that it wasn't until MOB got her Medicaid approved by her social worker that she was able to get [facility]. MOB reported that she was delayed care due to this. MOB reports that once her Medicaid was confirmed, she had about 6 visits at [facility] understanding and advised MOB of the hospital drug screen policy. MOB was given information on what steps would occur if infant's UDS or CDS was positive. MOB reported that she understood and had no further questions on drug screen policy.    CSW inquired from [facility] on her mental health. MOB declines having any mental health diagnosis and reports that she has been feeling fine since giving birth. MOB reported to CSW that her supports are her  mom at this time. MOB informed CSW that she has all needed items to care for infant with no other needs at this time. Throughout assessment Mob was engaged and very polite.   CSW provided MOB with education on PPD and SIDS. MOB was given PPD Checklist in order to keep track of her feelings as they relate to PPD. CSW will continue to monitor infants CDS and UDS for CPS report if warranted.   CSW Plan/Description:  No Further Intervention Required/No Barriers to Discharge, Sudden Infant Death Syndrome (SIDS) Education, Perinatal Mood and Anxiety Disorder (PMADs) Education, CSW Will Continue to Monitor Umbilical Cord Tissue Drug Screen Results and Make Report if [facility] Drug Screen Policy Information    Alicia Avila 09/23/2019, 10:44 AM

## 2019-09-24 ENCOUNTER — Telehealth: Payer: BLUE CROSS/BLUE SHIELD | Admitting: Obstetrics & Gynecology

## 2019-09-30 ENCOUNTER — Encounter: Payer: BLUE CROSS/BLUE SHIELD | Admitting: Obstetrics and Gynecology

## 2019-09-30 ENCOUNTER — Other Ambulatory Visit: Payer: BLUE CROSS/BLUE SHIELD

## 2019-10-20 ENCOUNTER — Telehealth (INDEPENDENT_AMBULATORY_CARE_PROVIDER_SITE_OTHER): Payer: BLUE CROSS/BLUE SHIELD | Admitting: Advanced Practice Midwife

## 2019-10-20 ENCOUNTER — Other Ambulatory Visit: Payer: Self-pay

## 2019-10-20 NOTE — Progress Notes (Signed)
I connected with Alicia Avila on 10/20/19 at 10:35 AM EST by: MyChart and verified that I am speaking with the correct person using two identifiers.  Patient is located at home and provider is located at [facility].     The purpose of this virtual visit is to provide medical care while limiting exposure to the novel coronavirus. I discussed the limitations, risks, security and privacy concerns of performing an evaluation and management service and the availability of in person appointments. I also discussed with the patient that there may be a patient responsible charge related to this service. By engaging in this virtual visit, you consent to the provision of healthcare.  Additionally, you authorize for your insurance to be billed for the services provided during this visit.  The patient expressed understanding and agreed to proceed.  The following staff members participated in the virtual visit:  Louisa Second, RN and S. Jeronimo Greaves, CNM  Post Partum Visit Note Subjective:    Ms. Alicia Avila is a 33 y.o. G75P3003 female who presents for a postpartum visit. She is 4 weeks postpartum following a spontaneous vaginal delivery. I have fully reviewed the prenatal and intrapartum course. The delivery was at 39 weeks 3 days gestational weeks. Outcome: spontaneous vaginal delivery. Anesthesia: epidural. Postpartum course has been uncomplicated. Baby's course has been uncomplicated. Baby is feeding by breast. Bleeding staining only. Bowel function is normal. Bladder function is normal. Patient is sexually active. Contraception method is oral progesterone-only contraceptive. Postpartum depression screening: negative.  The following portions of the patient's history were reviewed and updated as appropriate: allergies, current medications, past family history, past medical history, past social history, past surgical history and problem list.  Review of Systems A comprehensive review of systems was negative.    Objective:  There were no vitals filed for this visit. Self-Obtained       Assessment:   No concerning findings on today's postpartum exam via MyChart.  Pap smear (ASCUS) and Colpo in 2016.   Plan:   1. Contraception: oral progesterone-only contraceptive prescribed at hospital discharge 2. Follow up in: 3-6 months for Pap.   Ten minutes of non-face-to-face time spent with the patient   Mallie Snooks, MSN, CNM Certified Nurse Midwife, [facility] for [employer], [place] [facility] 10/20/19 12:53 PM

## 2019-10-20 NOTE — Patient Instructions (Signed)

## 2019-11-02 ENCOUNTER — Other Ambulatory Visit: Payer: Self-pay | Admitting: *Deleted

## 2019-11-02 DIAGNOSIS — O24429 Gestational diabetes mellitus in childbirth, unspecified control: Secondary | ICD-10-CM

## 2019-11-04 ENCOUNTER — Other Ambulatory Visit: Payer: BLUE CROSS/BLUE SHIELD

## 2019-11-04 ENCOUNTER — Encounter: Payer: Self-pay | Admitting: Obstetrics and Gynecology

## 2020-09-18 ENCOUNTER — Other Ambulatory Visit: Payer: Self-pay | Admitting: Family Medicine

## 2020-11-19 NOTE — L&D Delivery Note (Signed)
OB/GYN Faculty Practice Delivery Note  Alicia Avila is a 35 y.o. [id number] s/p SVD at unknown gestation due to lack of prenatal care. She was admitted for spontaneous labor.   ROM: 0h 52m with meconium stained fluid (moderate) GBS Status: Unknown  Delivery Date/Time: 07/27/21 at 0008  Delivery: Called to room and patient was complete and pushing. Head delivered LOA. A shoulder dystocia was encountered and no addition traction was placed on the baby's head. The posterior arm was located and able to be delivered. The shoulders and body subsequently delivered in usual fashion. Nuchal cord present and reduced at the perineum with delivery. Infant with spontaneous cry, placed on mother's abdomen, dried and stimulated. Cord clamped and cut immediately for awaiting neonatology team at bedside. Cord blood drawn. Placenta delivered spontaneously with gentle cord traction. Fundus firm with massage and Pitocin. Labia, perineum, vagina, and cervix were inspected. Patient found to have a vaginal floor abrasion that was hemostatic and not repaired.    Placenta: Intact; 3VC - sent to pathology  Complications: Shoulder dystocia (30 second duration) Lacerations: Vaginal floor abrasion; hemostatic and not repaired  EBL: 150 cc Analgesia: None  Infant: Viable female  APGARs 8 and 78    Evalina Field, MD  OB/GYN Fellow, Faculty Practice

## 2021-07-26 ENCOUNTER — Other Ambulatory Visit: Payer: Self-pay

## 2021-07-26 ENCOUNTER — Inpatient Hospital Stay (HOSPITAL_COMMUNITY)
Admission: AD | Admit: 2021-07-26 | Discharge: 2021-07-28 | DRG: 807 | Disposition: A | Discharge status: Home / Self Care | Payer: Medicaid Other | Attending: Obstetrics & Gynecology | Admitting: Obstetrics & Gynecology

## 2021-07-26 ENCOUNTER — Encounter (HOSPITAL_COMMUNITY): Payer: Self-pay | Admitting: Obstetrics & Gynecology

## 2021-07-26 DIAGNOSIS — O322XX0 Maternal care for transverse and oblique lie, not applicable or unspecified: Secondary | ICD-10-CM | POA: Diagnosis not present

## 2021-07-26 DIAGNOSIS — Z98891 History of uterine scar from previous surgery: Secondary | ICD-10-CM

## 2021-07-26 DIAGNOSIS — O34211 Maternal care for low transverse scar from previous cesarean delivery: Secondary | ICD-10-CM | POA: Diagnosis not present

## 2021-07-26 DIAGNOSIS — O0930 Supervision of pregnancy with insufficient antenatal care, unspecified trimester: Secondary | ICD-10-CM

## 2021-07-26 DIAGNOSIS — O34219 Maternal care for unspecified type scar from previous cesarean delivery: Secondary | ICD-10-CM | POA: Diagnosis present

## 2021-07-26 DIAGNOSIS — Z20822 Contact with and (suspected) exposure to covid-19: Secondary | ICD-10-CM | POA: Diagnosis present

## 2021-07-26 DIAGNOSIS — O4202 Full-term premature rupture of membranes, onset of labor within 24 hours of rupture: Secondary | ICD-10-CM | POA: Diagnosis not present

## 2021-07-26 DIAGNOSIS — Z3A00 Weeks of gestation of pregnancy not specified: Secondary | ICD-10-CM | POA: Diagnosis not present

## 2021-07-26 MED ORDER — ACETAMINOPHEN 325 MG PO TABS: 650.0000 mg | ORAL_TABLET | ORAL | Status: DC | PRN

## 2021-07-26 MED ORDER — FENTANYL CITRATE (PF) 100 MCG/2ML IJ SOLN: 50.0000 ug | INTRAMUSCULAR | Status: DC | PRN

## 2021-07-26 MED ORDER — OXYCODONE-ACETAMINOPHEN 5-325 MG PO TABS: 1.0000 | ORAL_TABLET | ORAL | Status: DC | PRN

## 2021-07-26 MED ORDER — OXYTOCIN BOLUS FROM INFUSION
333.0000 mL | Freq: Once | INTRAVENOUS | Status: AC
  Administered 2021-07-27: 333 mL via INTRAVENOUS

## 2021-07-26 MED ORDER — SOD CITRATE-CITRIC ACID 500-334 MG/5ML PO SOLN: 30.0000 mL | ORAL | Status: DC | PRN

## 2021-07-26 MED ORDER — LIDOCAINE HCL (PF) 1 % IJ SOLN: 30.0000 mL | INTRAMUSCULAR | Status: DC | PRN

## 2021-07-26 MED ORDER — ONDANSETRON HCL 4 MG/2ML IJ SOLN: 4.0000 mg | Freq: Four times a day (QID) | INTRAMUSCULAR | Status: DC | PRN

## 2021-07-26 MED ORDER — OXYCODONE-ACETAMINOPHEN 5-325 MG PO TABS: 2.0000 | ORAL_TABLET | ORAL | Status: DC | PRN

## 2021-07-26 MED ORDER — OXYTOCIN-SODIUM CHLORIDE 30-0.9 UT/500ML-% IV SOLN
2.5000 [IU]/h | INTRAVENOUS | Status: DC
  Administered 2021-07-27: 2.5 [IU]/h via INTRAVENOUS
  Filled 2021-07-26: qty 500

## 2021-07-26 MED ORDER — LACTATED RINGERS IV SOLN: 500.0000 mL | INTRAVENOUS | Status: DC | PRN

## 2021-07-26 MED ORDER — LACTATED RINGERS IV SOLN: INTRAVENOUS | Status: DC

## 2021-07-26 NOTE — H&P (Signed)
OBSTETRIC ADMISSION HISTORY AND PHYSICAL  Alicia Avila is a 35 y.o. female [phone] with IUP at unknown gestation presenting for precipitous labor. She reports +FMs, no LOF, no VB, no blurry vision, headaches or peripheral edema, and RUQ pain.  She plans on breast feeding. She request OCPs for birth control.  She received no prenatal care with this pregnancy.   Prenatal History/Complications:  Hx of CS in G2 pregnancy for breech presentation Hx of GDM Hx of LGA infant   Past Medical History: Past Medical History:  Diagnosis Date   Medical history non-contributory     Past Surgical History: Past Surgical History:  Procedure Laterality Date   CESAREAN SECTION N/A 10/11/2017   Procedure: CESAREAN SECTION;  Surgeon: Catalina Antigua, MD;  Location: [facility];  Service: Obstetrics;  Laterality: N/A;   CESAREAN SECTION      Obstetrical History: OB History     Gravida  4   Para  4   Term  3   Preterm  0   AB      Living  4      SAB  0   IAB  0   Ectopic  0   Multiple  0   Live Births  4           Social History Social History   Socioeconomic History   Marital status: Married    Spouse name: Not on file   Number of children: Not on file   Years of education: Not on file   Highest education level: Not on file  Occupational History   Not on file  Tobacco Use   Smoking status: Never   Smokeless tobacco: Never  Substance and Sexual Activity   Alcohol use: No   Drug use: No   Sexual activity: Yes    Birth control/protection: None  Other Topics Concern   Not on file  Social History Narrative   Not on file   Social Determinants of Health   Financial Resource Strain: Not on file  Food Insecurity: Not on file  Transportation Needs: Not on file  Physical Activity: Not on file  Stress: Not on file  Social Connections: Not on file    Family History: History reviewed. No pertinent family history.  Allergies: No Known  Allergies  Medications Prior to Admission  Medication Sig Dispense Refill Last Dose   acetaminophen (TYLENOL) 325 MG tablet Take 2 tablets (650 mg total) by mouth every 4 (four) hours as needed (for pain scale < 4). 30 tablet 0 Unknown   ibuprofen (ADVIL) 600 MG tablet Take 1 tablet (600 mg total) by mouth every 6 (six) hours. 30 tablet 0 Unknown   norethindrone (ORTHO MICRONOR) 0.35 MG tablet Take 1 tablet (0.35 mg total) by mouth daily. 1 Package 11    prenatal vitamin w/FE, FA (PRENATAL 1 + 1) 27-1 MG TABS tablet Take 1 tablet by mouth daily.    Unknown     Review of Systems  All systems reviewed and negative except as stated in HPI  Blood pressure 124/72, pulse 62, temperature 97.7 F (36.5 C), temperature source Oral, resp. rate 18, height 5\' 4"  (1.626 m), weight 89.8 kg, unknown if currently breastfeeding.  General appearance: alert, cooperative, and breathing through contractions Lungs: normal WOB on room air  Heart: normal rate, warm and well perfused  Abdomen: soft, gravid Extremities: no LE edema, normal ROM   Presentation: cephalic Fetal monitoring: Baseline 150 bpm, moderate variability - unable to fully assess  due to limited time on monitor prior to delivery  Uterine activity: Every 2-3 minutes  Dilation: 8.5 Effacement (%): 90 Station: -3 Exam by:: Wynelle Bourgeois, CNM   Prenatal labs: ABO, Rh: --/--/O POS (09/08 0017) Antibody: NEG (09/08 0017) Rubella:   RPR:    HBsAg: NON REACTIVE (09/08 0017)  HIV: NON REACTIVE (09/08 0114)  GBS:  Unknown   1 hr Glucola - Not done  Genetic screening - Not done  Anatomy US - Not done   Prenatal Transfer Tool  Maternal Diabetes: Unknown  Genetic Screening: Not done  Maternal Ultrasounds/Referrals: None  Fetal Ultrasounds or other Referrals:  None  Maternal Substance Abuse:  Denies  Significant Maternal Medications:  None  Significant Maternal Lab Results: Unknown; prenatal labs ordered on admission  Results for  orders placed or performed during the hospital encounter of 07/26/21 (from the past 24 hour(s))  Resp Panel by RT-PCR (Flu A&B, Covid) Nasopharyngeal Swab   Collection Time: 07/26/21 11:52 PM   Specimen: Nasopharyngeal Swab; Nasopharyngeal(NP) swabs in vial transport medium  Result Value Ref Range   SARS Coronavirus 2 by RT PCR NEGATIVE NEGATIVE   Influenza A by PCR NEGATIVE NEGATIVE   Influenza B by PCR NEGATIVE NEGATIVE  CBC   Collection Time: 07/26/21 11:57 PM  Result Value Ref Range   WBC 12.8 (H) 4.0 - 10.5 K/uL   RBC 4.58 3.87 - 5.11 MIL/uL   Hemoglobin 12.1 12.0 - 15.0 g/dL   HCT 35.7 01.7 - 79.3 %   MCV 82.3 80.0 - 100.0 fL   MCH 26.4 26.0 - 34.0 pg   MCHC 32.1 30.0 - 36.0 g/dL   RDW 90.3 (H) 00.9 - 23.3 %   Platelets 265 150 - 400 K/uL   nRBC 0.2 0.0 - 0.2 %  Hepatitis C antibody   Collection Time: 07/27/21 12:17 AM  Result Value Ref Range   HCV Ab NON REACTIVE NON REACTIVE  Hepatitis B surface antigen   Collection Time: 07/27/21 12:17 AM  Result Value Ref Range   Hepatitis B Surface Ag NON REACTIVE NON REACTIVE  Type and screen [facility]   Collection Time: 07/27/21 12:17 AM  Result Value Ref Range   ABO/RH(D) O POS    Antibody Screen NEG    Sample Expiration      07/30/2021,2359 Performed at [facility], [address], [place], [place] [address]   Rapid HIV screen (HIV 1/2 Ab+Ag)   Collection Time: 07/27/21  1:14 AM  Result Value Ref Range   HIV-1 P24 Antigen - HIV24 NON REACTIVE NON REACTIVE   HIV 1/2 Antibodies NON REACTIVE NON REACTIVE   Interpretation (HIV Ag Ab)      A non reactive test result means that HIV 1 or HIV 2 antibodies and HIV 1 p24 antigen were not detected in the specimen.  Resp Panel by RT-PCR (Flu A&B, Covid) Nasopharyngeal Swab   Collection Time: 07/27/21  1:35 AM   Specimen: Nasopharyngeal Swab; Nasopharyngeal(NP) swabs in vial transport medium  Result Value Ref Range   SARS Coronavirus 2 by RT PCR NEGATIVE  NEGATIVE   Influenza A by PCR NEGATIVE NEGATIVE   Influenza B by PCR NEGATIVE NEGATIVE    Patient Active Problem List   Diagnosis Date Noted   VBAC, delivered 07/26/2021   Supervision of high risk pregnancy in third trimester 09/22/2019   Fetus large for gestational age, delivered, current hospitalization 09/22/2019   Group B streptococcal bacteriuria 08/26/2019   Supervision of  high risk pregnancy, antepartum 08/10/2019   No prenatal care in current pregnancy 08/10/2019   History of gestational diabetes mellitus (GDM) 08/10/2019   S/P cesarean section 10/11/2017    Assessment/Plan:  Dalasia Predmore is a 35 y.o. G4P3003 at unknown gestation here for precipitous labor.   #Labor: SROM shortly after arrival to floor with moderate meconium stained fluid. SVE 10/100/+4. Anticipate SVD shortly.  #Pain:   Maternal support  #FWB:  Reassuring baseline and variability  #ID:   GBS unknown; patient without IV with imminent delivery, no antibiotics able to be started  #MOF:  Breast #MOC:  OCPs  #No prenatal care: Prenatal labs ordered. SW to be consulted postpartum.  Worthy Rancher, MD  07/27/2021, 2:49 AM

## 2021-07-27 ENCOUNTER — Encounter (HOSPITAL_COMMUNITY): Payer: Self-pay | Admitting: Obstetrics & Gynecology

## 2021-07-27 DIAGNOSIS — O4202 Full-term premature rupture of membranes, onset of labor within 24 hours of rupture: Secondary | ICD-10-CM

## 2021-07-27 DIAGNOSIS — Z3A00 Weeks of gestation of pregnancy not specified: Secondary | ICD-10-CM

## 2021-07-27 DIAGNOSIS — O34211 Maternal care for low transverse scar from previous cesarean delivery: Secondary | ICD-10-CM

## 2021-07-27 DIAGNOSIS — O322XX0 Maternal care for transverse and oblique lie, not applicable or unspecified: Secondary | ICD-10-CM

## 2021-07-27 LAB — RESP PANEL BY RT-PCR (FLU A&B, COVID) ARPGX2
Influenza A by PCR: NEGATIVE
Influenza A by PCR: NEGATIVE
Influenza B by PCR: NEGATIVE
Influenza B by PCR: NEGATIVE
SARS Coronavirus 2 by RT PCR: NEGATIVE
SARS Coronavirus 2 by RT PCR: NEGATIVE

## 2021-07-27 LAB — CBC
HCT: 37.7 % (ref 36.0–46.0)
Hemoglobin: 12.1 g/dL (ref 12.0–15.0)
MCH: 26.4 pg (ref 26.0–34.0)
MCHC: 32.1 g/dL (ref 30.0–36.0)
MCV: 82.3 fL (ref 80.0–100.0)
Platelets: 265 10*3/uL (ref 150–400)
RBC: 4.58 MIL/uL (ref 3.87–5.11)
RDW: 17.2 % — ABNORMAL HIGH (ref 11.5–15.5)
WBC: 12.8 10*3/uL — ABNORMAL HIGH (ref 4.0–10.5)
nRBC: 0.2 % (ref 0.0–0.2)

## 2021-07-27 LAB — HEPATITIS B SURFACE ANTIGEN: Hepatitis B Surface Ag: NONREACTIVE

## 2021-07-27 LAB — HEPATITIS C ANTIBODY: HCV Ab: NONREACTIVE

## 2021-07-27 LAB — RPR: RPR Ser Ql: NONREACTIVE

## 2021-07-27 LAB — TYPE AND SCREEN
ABO/RH(D): O POS
Antibody Screen: NEGATIVE

## 2021-07-27 LAB — RAPID HIV SCREEN (HIV 1/2 AB+AG)
HIV 1/2 Antibodies: NONREACTIVE
HIV-1 P24 Antigen - HIV24: NONREACTIVE

## 2021-07-27 MED ORDER — SIMETHICONE 80 MG PO CHEW: 80.0000 mg | CHEWABLE_TABLET | ORAL | Status: DC | PRN

## 2021-07-27 MED ORDER — MEDROXYPROGESTERONE ACETATE 150 MG/ML IM SUSP: 150.0000 mg | INTRAMUSCULAR | Status: DC | PRN

## 2021-07-27 MED ORDER — TETANUS-DIPHTH-ACELL PERTUSSIS 5-2.5-18.5 LF-MCG/0.5 IM SUSY: 0.5000 mL | PREFILLED_SYRINGE | Freq: Once | INTRAMUSCULAR | Status: DC

## 2021-07-27 MED ORDER — ONDANSETRON HCL 4 MG/2ML IJ SOLN: 4.0000 mg | INTRAMUSCULAR | Status: DC | PRN

## 2021-07-27 MED ORDER — BENZOCAINE-MENTHOL 20-0.5 % EX AERO: 1.0000 "application " | INHALATION_SPRAY | CUTANEOUS | Status: DC | PRN

## 2021-07-27 MED ORDER — DIBUCAINE (PERIANAL) 1 % EX OINT: 1.0000 "application " | TOPICAL_OINTMENT | CUTANEOUS | Status: DC | PRN

## 2021-07-27 MED ORDER — WITCH HAZEL-GLYCERIN EX PADS: 1.0000 "application " | MEDICATED_PAD | CUTANEOUS | Status: DC | PRN

## 2021-07-27 MED ORDER — ACETAMINOPHEN 325 MG PO TABS
650.0000 mg | ORAL_TABLET | ORAL | Status: DC | PRN
  Filled 2021-07-27: qty 2

## 2021-07-27 MED ORDER — IBUPROFEN 600 MG PO TABS
600.0000 mg | ORAL_TABLET | Freq: Four times a day (QID) | ORAL | Status: DC
  Administered 2021-07-27 – 2021-07-28 (×6): 600 mg via ORAL
  Filled 2021-07-27 (×6): qty 1

## 2021-07-27 MED ORDER — ONDANSETRON HCL 4 MG PO TABS: 4.0000 mg | ORAL_TABLET | ORAL | Status: DC | PRN

## 2021-07-27 MED ORDER — MEASLES, MUMPS & RUBELLA VAC IJ SOLR: 0.5000 mL | Freq: Once | INTRAMUSCULAR | Status: DC

## 2021-07-27 MED ORDER — IBUPROFEN 600 MG PO TABS
600.0000 mg | ORAL_TABLET | Freq: Once | ORAL | Status: AC
  Administered 2021-07-27: 600 mg via ORAL
  Filled 2021-07-27: qty 1

## 2021-07-27 MED ORDER — COCONUT OIL OIL: 1.0000 "application " | TOPICAL_OIL | Status: DC | PRN

## 2021-07-27 MED ORDER — DIPHENHYDRAMINE HCL 25 MG PO CAPS: 25.0000 mg | ORAL_CAPSULE | Freq: Four times a day (QID) | ORAL | Status: DC | PRN

## 2021-07-27 MED ORDER — PRENATAL MULTIVITAMIN CH
1.0000 | ORAL_TABLET | Freq: Every day | ORAL | Status: DC
  Administered 2021-07-27 – 2021-07-28 (×2): 1 via ORAL
  Filled 2021-07-27 (×2): qty 1

## 2021-07-27 MED ORDER — SENNOSIDES-DOCUSATE SODIUM 8.6-50 MG PO TABS
2.0000 | ORAL_TABLET | Freq: Every day | ORAL | Status: DC
  Administered 2021-07-28: 2 via ORAL
  Filled 2021-07-27: qty 2

## 2021-07-27 NOTE — Discharge Summary (Signed)
Postpartum Discharge Summary     Patient Name: Alicia Avila DOB: 06-26-1986 MRN: [id number]  Date of admission: 07/26/2021 Delivery date:07/27/2021  Delivering provider: Genia Del  Date of discharge: 07/28/2021  Admitting diagnosis: Indication for care in labor or delivery [O75.9] Intrauterine pregnancy: Unknown     Secondary diagnosis:  Principal Problem:   VBAC, delivered Active Problems:   S/P cesarean section   No prenatal care in current pregnancy  Additional problems: as above    Discharge diagnosis:  VBAC; delivered                                 Post partum procedures: none Augmentation: N/A Complications: None  Hospital course: Onset of Labor With Vaginal Delivery      35 y.o. yo [phone] at unknown gestation was admitted in Active Labor on 07/26/2021. Patient had an uncomplicated labor course as follows:  Membrane Rupture Time/Date: 11:56 PM ,07/26/2021   Delivery Method:VBAC, Spontaneous  Episiotomy: None  Lacerations:  Vaginal  Patient had an uncomplicated postpartum course.  She is ambulating, tolerating a regular diet, passing flatus, and urinating well. Patient is discharged home in stable condition on 07/28/21.  Newborn Data: Birth date:07/27/2021  Birth time:12:08 AM  Gender:Female  Living status:Living  Apgars:8 ,9  Weight:4315 g   Magnesium Sulfate received: No BMZ received: No Rhophylac:N/A MMR:N/A T-DaP: Offered postpartum Flu: No Transfusion:No  Physical exam  Vitals:   07/27/21 1005 07/27/21 1509 07/27/21 2133 07/28/21 0530  BP: 118/61 106/61 109/72 107/72  Pulse: 74 75 85 92  Resp: 18 18 16 17   Temp: 98 F (36.7 C) 98.9 F (37.2 C) 98.7 F (37.1 C) 98.4 F (36.9 C)  TempSrc: Oral Oral Oral Oral  SpO2:   99% 99%  Weight:      Height:       General: alert, cooperative, and no distress Lochia: appropriate Uterine Fundus: firm Incision: N/A DVT Evaluation: No evidence of DVT seen on physical exam.  Labs: Lab Results   Component Value Date   WBC 12.8 (H) 07/26/2021   HGB 12.1 07/26/2021   HCT 37.7 07/26/2021   MCV 82.3 07/26/2021   PLT 265 07/26/2021   CMP Latest Ref Rng & Units 09/22/2019  Glucose 70 - 99 mg/dL 104(H)  BUN 6 - 20 mg/dL 13  Creatinine 0.44 - 1.00 mg/dL 0.67  Sodium 135 - 145 mmol/L 135  Potassium 3.5 - 5.1 mmol/L 4.1  Chloride 98 - 111 mmol/L 104  CO2 22 - 32 mmol/L 16(L)  Calcium 8.9 - 10.3 mg/dL 10.0  Total Protein 6.5 - 8.1 g/dL 6.7  Total Bilirubin 0.3 - 1.2 mg/dL 0.3  Alkaline Phos 38 - 126 U/L 180(H)  AST 15 - 41 U/L 21  ALT 0 - 44 U/L 15   Edinburgh Score: Edinburgh Postnatal Depression Scale Screening Tool 07/27/2021  I have been able to laugh and see the funny side of things. 1  I have looked forward with enjoyment to things. 1  I have blamed myself unnecessarily when things went wrong. 0  I have been anxious or worried for no good reason. 1  I have felt scared or panicky for no good reason. 1  Things have been getting on top of me. 2  I have been so unhappy that I have had difficulty sleeping. 1  I have felt sad or miserable. 1  I have been so unhappy that I  have been crying. 0  The thought of harming myself has occurred to me. 0  Edinburgh Postnatal Depression Scale Total 8     After visit meds:  Allergies as of 07/28/2021   No Known Allergies      Medication List     TAKE these medications    acetaminophen 325 MG tablet Commonly known as: Tylenol Take 2 tablets (650 mg total) by mouth every 4 (four) hours as needed (for pain scale < 4).   ibuprofen 600 MG tablet Commonly known as: ADVIL Take 1 tablet (600 mg total) by mouth every 6 (six) hours.   norethindrone 0.35 MG tablet Commonly known as: Ortho Micronor Take 1 tablet (0.35 mg total) by mouth daily.   prenatal vitamin w/FE, FA 27-1 MG Tabs tablet Take 1 tablet by mouth daily.         Discharge home in stable condition Infant Feeding: Breast Infant Disposition:home with  mother Discharge instruction: per After Visit Summary and Postpartum booklet. Activity: Advance as tolerated. Pelvic rest for 6 weeks.  Diet: routine diet Future Appointments:No future appointments. Follow up Visit:  [place]. Schedule an appointment as soon as possible for a visit in 5 week(s).   Specialty: Obstetrics and Gynecology Why: Please call to schedule a follow up appointment Contact information: [address], [place] [phone]               Message sent to [facility] by Dr. Gwenlyn Perking on 07/27/21.  Please schedule this patient for a In person postpartum visit in 4 weeks with the following provider: Any provider. Additional Postpartum F/U: None   Pregnancy complicated by:  No prenatal care Delivery mode:  VBAC, Spontaneous  Anticipated Birth Control:  POPs   07/28/2021 Annalee Genta, DO

## 2021-07-27 NOTE — Lactation Note (Signed)
This note was copied from a baby's chart. Lactation Consultation Note  Patient Name: Alicia Avila MWUXL'K Date: 07/27/2021 Reason for consult: L&D Initial assessment;Term (LGA infant greater than 9 lbs) Age:35 hours LC entered the room, mom had had infant latched on her right breast using the cross cradle hold, infant was latched with depth and was still breastfeeding after 15 minutes when LC left the room. Mom knows to breastfeed infant according to primal cues: licking, kissing, tasting, smacking, rooting and breastfeed infant skin to skin. Mom knows to call RN/LC on MBU if she need further assistance with latching infant at the breast. LC discussed infant's input and output with mom. Maternal Data Has patient been taught Hand Expression?: Yes Does the patient have breastfeeding experience prior to this delivery?: Yes How long did the patient breastfeed?: Per mom, she breastfeed her 1st child for 60months, 2nd child for 5 months, 3rd child for one year.  Feeding Mother's Current Feeding Choice: Breast Milk  LATCH Score Latch: Grasps breast easily, tongue down, lips flanged, rhythmical sucking.  Audible Swallowing: Spontaneous and intermittent  Type of Nipple: Everted at rest and after stimulation  Comfort (Breast/Nipple): Soft / non-tender  Hold (Positioning): Assistance needed to correctly position infant at breast and maintain latch.  LATCH Score: 9   Lactation Tools Discussed/Used    Interventions Interventions: Breast feeding basics reviewed;Assisted with latch;Skin to skin;Adjust position;Breast compression;Support pillows;Breast massage;Position options;Hand express;Expressed milk  Discharge Pump: Personal (Per mom, she has DEBP at home.) [facility] Program: No  Consult Status Consult Status: Follow-up Date: 07/28/21 Follow-up type: In-patient    [name] 07/27/2021, 2:35 AM

## 2021-07-27 NOTE — Clinical Social Work Maternal (Addendum)
CLINICAL SOCIAL WORK MATERNAL/CHILD NOTE  Patient Details  Name: Oliver Neuwirth MRN: [id number] Date of Birth: February 21, 1986  Date:  07/27/2021  Clinical Social Worker Initiating Note:  Kathrin Greathouse, LCSW Date/Time: Initiated:  07/27/21/1210     Child's Name:      Biological Parents:  Mother, Father (Father: Vladimir Faster 11-10-76)   Need for Interpreter:  None   Reason for Referral:  Late or No Prenatal Care     Address:  [place] [address]    Phone number:  [phone] (home)     Additional phone number:   Household Members/Support Persons (HM/SP):   Household Member/Support Person 1, Household Member/Support Person 3, Household Member/Support Person 4, Household Member/Support Person 2   HM/SP Name Relationship DOB or Age  HM/SP -1 Mahammed Heide Spouse 11-10-1976  HM/SP -2 Akeem Mahammed Son 10-11-2017  HM/SP -3 Lenon Ahmadi Daughter 08-18-2015  HM/SP -4 Jannetta Quint Son 09-22-2019  HM/SP -5        HM/SP -6        HM/SP -7        HM/SP -8          Natural Supports (not living in the home):  Parent   Professional Supports: Case Metallurgist   Employment: Unemployed   Type of Work:     Education:  Forensic psychologist   Homebound arranged:    Museum/gallery curator Resources:      Other Resources:  [facility]   Cultural/Religious Considerations Which May Impact Care:    Strengths:  Ability to meet basic needs  , Home prepared for child  , Pediatrician chosen   Psychotropic Medications:         Pediatrician:    [place]  Pediatrician List:   [place] [facility]  [facility]      Pediatrician Fax Number:    Risk Factors/Current Problems:      Cognitive State:  Able to Concentrate  , Alert  , Linear Thinking  , Insightful     Mood/Affect:  Calm  , Comfortable     CSW Assessment:  CSW received consult  for no PNC. CSW met with MOB to offer support and complete assessment.   CSW met with MOB at bedside. CSW introduced role and congratulated MOB and FOB. CSW observed MOB holding the infant and FOB present at bedside. MOB presented calm and receptive to [place] visit. CSW offered MOB privacy. MOB gave CSW permission to share information with FOB present. MOB confirmed the demographic information on hospital file is correct. MOB shared in her household it is her spouse and three children (see list above). CSW inquired why MOB received no PNC. MOB reported she did not receive PNC because she had no insurance. She has been working with a Conservator, museum/gallery to arrange Medicaid and should receive Medicaid soon. CSW informed MOB about the hospital drug screen policy and a report will be filed with CPS if warranted. MOB reported understanding and had no questions. CSW inquired if MOB received WIC/FS. MOB shared she has not applied for [facility]. CSW offered to assist with scheduling the [facility] appointment. MOB agreeable for CSW to assist. MOB understands she will need to apply for Food Stamps through [place]. CSW inquired if MOB has essential items for the infant. MOB shared her spouse will purchase a car seat today,  the infant will sleep in a crib, she has some baby clothes, and has some newborn diapers but the baby will not be able to wear them due his size. CSW provided information [employer]. MOB gave CSW permission to make a referral. MOB reported she has chosen [facility] for infant's follow up and will have transportation to appointments. CSW provided [facility] transportation contact if transportation is needed to follow appointments. The Ride Waiver form was completed.   CSW inquired if MOB has history of mental health. MOB denied history of mental health. CSW inquired if MOB experienced postpartum depression/anxiety. MOB shared she has not experienced postpartum depression/ anxiety. CSW provided education  regarding the baby blues period vs. perinatal mood disorders, discussed treatment and gave resources for mental health follow up if concerns arise. CSW recommended MOB complete self-evaluation during the postpartum time period using the New Mom Checklist from Postpartum Progress and encouraged MOB to contact a medical professional if symptoms are noted at any time. MOB reported she feels comfortable reaching out to her physician if concerns arise. CSW assessed MOB for safety. MOB denied thoughts of harm to self and others.   CSW provided review of Sudden Infant Death Syndrome (SIDS) precautions.   - CSW provided a list of community resources for child care, food assistance and health services -Infant's UDS negative. CSW will follow the CDS and make a report to CPS, if warranted.  -CSW made a referral to [employer] -CSW scheduled [facility] appointment for September 21, @ 11:30 AM   CSW identifies no further need for intervention and no barriers to discharge at this time.   CSW Plan/Description:  Hospital Drug Screen Policy Information, Perinatal Mood and Anxiety Disorder (PMADs) Education, CSW Will Continue to Monitor Umbilical Cord Tissue Drug Screen Results and Make Report if Warranted, Sudden Infant Death Syndrome (SIDS) Education, Other Information/Referral to [employer], No Further Intervention Required/No Barriers to Discharge    Lia Hopping, [place] 07/27/2021, 12:21 PM

## 2021-07-27 NOTE — Lactation Note (Signed)
This note was copied from a baby's chart. Lactation Consultation Note  Patient Name: Alicia Avila WNIOE'V Date: 07/27/2021 Reason for consult: Initial assessment;Term Age:35 hours  LC in to room for initial consult. Mother states infant has been latching well, no pain or discomfort. Infant is cueing, LC offered assistance with latch.  Reviewed normal newborn behavior during first 24h, expected output, tummy size, ways to stimulate baby and feeding frequency. Talked about milk coming into volume.   Plan: 1-Skin to skin, aim for a deep, comfortable latch and breastfeed on demand or 8-12 times in 24h period. 2-Encouraged maternal rest, hydration and food intake.  3-Contact LC as needed for feeds/support/concerns/questions   All questions answered at this time. Provided Lactation services brochure and promoted [employer] booklet information.    Maternal Data Has patient been taught Hand Expression?: Yes Does the patient have breastfeeding experience prior to this delivery?: Yes How long did the patient breastfeed?: up to 8 months  Feeding Mother's Current Feeding Choice: Breast Milk  LATCH Score Latch: Grasps breast easily, tongue down, lips flanged, rhythmical sucking.  Audible Swallowing: Spontaneous and intermittent  Type of Nipple: Everted at rest and after stimulation  Comfort (Breast/Nipple): Soft / non-tender  Hold (Positioning): Assistance needed to correctly position infant at breast and maintain latch.  LATCH Score: 9  Interventions Interventions: Breast feeding basics reviewed;Assisted with latch;Skin to skin;Hand express;Support pillows;Education;Expressed milk  Discharge WIC Program: No (just applied)  Consult Status Consult Status: Follow-up Date: 07/28/21 Follow-up type: In-patient    Alicia Avila Ancidey 07/27/2021, 6:14 PM

## 2021-07-27 NOTE — MAU Note (Signed)
Patient arrived to MAU. Patient Access called to nurse station stating patient was ctx, in pain screaming. RN went out to get patient and brought her to the room. Patient was placed on the EFM and obtained FHR of 140. MAU provider CNM performed SVE stating 8-9cm, 90%, with a bulging bag. Patient stated she has not had a provider for this pregnancy. Charge RN called L/D for a room for delivery. RN called MD Mathis Fare for admission orders. Covid swab obtained. MAU provider supervised transport to L/D.

## 2021-07-27 NOTE — Lactation Note (Signed)
This note was copied from a baby's chart. Lactation Consultation Note  Patient Name: Alicia Avila LOVFI'E Date: 07/27/2021 Age:35 hours  Mom and baby are sleeping upon visit. LC will come back to room at another time as possible.     Corinthia Helmers A Higuera Ancidey 07/27/2021, 4:41 PM

## 2021-07-28 LAB — SURGICAL PATHOLOGY

## 2021-07-28 LAB — RUBELLA SCREEN: Rubella: 1.86 index (ref >0.99)

## 2021-07-28 MED ORDER — NORETHINDRONE 0.35 MG PO TABS: 1.0000 | ORAL_TABLET | Freq: Every day | ORAL | 4 refills | Status: AC

## 2021-07-28 NOTE — Lactation Note (Signed)
This note was copied from a baby's chart. Lactation Consultation Note  Patient Name: Alicia Avila CHYIF'O Date: 07/28/2021 Reason for consult: Follow-up assessment (Unknown gestation) Age:35 hours   P4 mother whose infant is now 7 hours old.  This is an unknown gestation due to no [facility], however, baby appears to be term.  Mother was breast feeding when I arrived; stated he has been cluster feeding.  Upon observation I noted that he was sleepy and had a shallow latch.  Suggestions given to mother and also suggested she stimulate him to keep him awake and actively feeding.  With stimulation, he began feeding better.  Discussed nutritive vs non-nutritive sucking.  Observed him feeding for approximately 5 minutes with increasing sleepiness.  Asked mother to place him STS; he fell asleep.  Baby is voiding/stooling well.  She will continue feeding on cue and call for latch assistance as needed.  Father present.     Maternal Data Has patient been taught Hand Expression?: Yes Does the patient have breastfeeding experience prior to this delivery?: Yes How long did the patient breastfeed?: 8 months with last child  Feeding Mother's Current Feeding Choice: Breast Milk  LATCH Score Latch: Grasps breast easily, tongue down, lips flanged, rhythmical sucking.  Audible Swallowing: A few with stimulation  Type of Nipple: Everted at rest and after stimulation  Comfort (Breast/Nipple): Soft / non-tender  Hold (Positioning): Assistance needed to correctly position infant at breast and maintain latch.  LATCH Score: 8   Lactation Tools Discussed/Used    Interventions Interventions: Breast feeding basics reviewed;Skin to skin;Education  Discharge WIC Program: Yes  Consult Status Consult Status: Follow-up Date: 07/29/21 Follow-up type: In-patient    Avira Tillison R Robson Trickey 07/28/2021, 9:32 AM

## 2021-08-09 ENCOUNTER — Telehealth (HOSPITAL_COMMUNITY): Payer: Self-pay

## 2021-08-09 NOTE — Telephone Encounter (Signed)
No answer. Mailbox is full.   Marcelino Duster [facility] 08/09/2021,1915

## 2021-08-23 ENCOUNTER — Other Ambulatory Visit: Payer: Self-pay

## 2021-08-23 ENCOUNTER — Other Ambulatory Visit (HOSPITAL_COMMUNITY)
Admission: RE | Admit: 2021-08-23 | Discharge: 2021-08-23 | Disposition: A | Discharge status: Home / Self Care | Payer: Medicaid Other | Source: Ambulatory Visit | Attending: Obstetrics & Gynecology | Admitting: Obstetrics & Gynecology

## 2021-08-23 ENCOUNTER — Ambulatory Visit (INDEPENDENT_AMBULATORY_CARE_PROVIDER_SITE_OTHER): Payer: Medicaid Other | Admitting: Certified Nurse Midwife

## 2021-08-23 ENCOUNTER — Encounter: Payer: Self-pay | Admitting: Certified Nurse Midwife

## 2021-08-23 DIAGNOSIS — Z124 Encounter for screening for malignant neoplasm of cervix: Secondary | ICD-10-CM | POA: Diagnosis not present

## 2021-08-23 NOTE — Progress Notes (Signed)
Post Partum Visit Note  Alicia Avila is a 35 y.o. [id number] female who presents for a postpartum visit. She is 4 weeks postpartum following a normal spontaneous vaginal delivery.  I have fully reviewed the prenatal and intrapartum course. The delivery was at 39 gestational weeks.  Anesthesia: none. Postpartum course has been normal and uncomplicated. Baby is doing well. Baby is feeding by breast. Bleeding staining only. Bowel function is normal. Bladder function is normal. Patient is not sexually active. Contraception method is none. Postpartum depression screening: negative.   Upstream - 08/23/21 1051       Pregnancy Intention Screening   Does the patient want to become pregnant in the next year? No    Does the patient's partner want to become pregnant in the next year? No    Would the patient like to discuss contraceptive options today? N/A   Already prescribed POPs, plans to start at 6wks pp     Contraception Wrap Up   Contraception Counseling Provided No            The pregnancy intention screening data noted above was reviewed. Potential methods of contraception were discussed. The patient elected to proceed with No data recorded.   Edinburgh Postnatal Depression Scale - 08/23/21 0957       Edinburgh Postnatal Depression Scale:  In the Past 7 Days   I have been able to laugh and see the funny side of things. 0    I have looked forward with enjoyment to things. 0    I have blamed myself unnecessarily when things went wrong. 0    I have been anxious or worried for no good reason. 0    I have felt scared or panicky for no good reason. 0    Things have been getting on top of me. 0    I have been so unhappy that I have had difficulty sleeping. 0    I have felt sad or miserable. 0    I have been so unhappy that I have been crying. 0    The thought of harming myself has occurred to me. 0    Edinburgh Postnatal Depression Scale Total 0             Health Maintenance Due   Topic Date Due   COVID-19 Vaccine (1) Never done   PAP SMEAR-Modifier  Never done   INFLUENZA VACCINE  06/19/2021    The following portions of the patient's history were reviewed and updated as appropriate: allergies, current medications, past family history, past medical history, past social history, past surgical history, and problem list.  Review of Systems Pertinent items noted in HPI and remainder of comprehensive ROS otherwise negative.  Objective:  BP 117/78   Pulse (!) 58   Wt 176 lb 11.2 oz (80.2 kg)   Breastfeeding Yes   BMI 30.33 kg/m    General:  alert, cooperative, appears stated age, fatigued, and no distress   Breasts:  normal  Lungs: Normal effort  Heart:  regular rate and rhythm  Abdomen: Soft, non-tender    Wound N/A  GU exam:  normal, pap collected       Assessment:  Postpartum care and examination of lactating mother Cervical cancer screening  Plan:   Essential components of care per ACOG recommendations:  1.  Mood and well being: Patient with negative depression screening today. Reviewed local resources for support.  - Patient tobacco use? No.   - hx of drug use?  No.    2. Infant care and feeding:  -Patient currently breastmilk feeding? Yes. Reviewed importance of draining breast regularly to support lactation.  - Reported breast pain with feeding and thick white patches covering the baby's tongue that do not come off. Thrush treatment sent to pharmacy. -Social determinants of health (SDOH) reviewed in EPIC. No concerns  3. Sexuality, contraception and birth spacing - Patient does not want a pregnancy in the next year.  Desired family size is 4 children.  - Reviewed forms of contraception in tiered fashion. Patient desired oral progesterone-only contraceptive today.   - Discussed birth spacing of 18 months  4. Sleep and fatigue -Encouraged family/partner/community support of 4 hrs of uninterrupted sleep to help with mood and fatigue  5.  Physical Recovery  - Discussed patients delivery and complications. She describes her labor as good. - Patient had a Vaginal, no problems at delivery. Patient had  no  laceration. Perineal healing reviewed. Patient expressed understanding - Patient has urinary incontinence? No. - Patient is safe to resume physical and sexual activity when ready  6.  Health Maintenance - HM due items addressed Yes - Last pap smear in 2016. Pap smear done at today's visit.  -Breast Cancer screening indicated? No.   7. Chronic Disease/Pregnancy Condition follow up: None  - PCP follow up  Bernerd Limbo, CNM [facility] [employer], [facility]

## 2021-08-24 LAB — CYTOLOGY - PAP
Comment: NEGATIVE
Diagnosis: NEGATIVE
High risk HPV: NEGATIVE

## 2021-08-24 MED ORDER — NYSTATIN 100000 UNIT/ML MT SUSP: 2.0000 mL | Freq: Four times a day (QID) | OROMUCOSAL | 0 refills | Status: AC

## 2021-08-24 MED ORDER — FLUCONAZOLE 150 MG PO TABS: 150.0000 mg | ORAL_TABLET | Freq: Once | ORAL | 0 refills | Status: AC

## 2021-08-31 ENCOUNTER — Telehealth: Payer: Self-pay | Admitting: Lactation Services

## 2021-08-31 NOTE — Telephone Encounter (Signed)
Received a prescription request from [facility] in regards to mom being able to get Nystatin for infant, called mom and she reports she was able to get the medication. She reports she is feeling better and has no questions or concerns at this time.
